# Patient Record
Sex: Female | Born: 1985 | Race: White | Hispanic: No | Marital: Married | Smoking: Never smoker
Health system: Southern US, Community
[De-identification: ages and names within clinical notes are randomized; demographics above are authoritative.]

## PROBLEM LIST (undated history)

## (undated) ENCOUNTER — Inpatient Hospital Stay (HOSPITAL_COMMUNITY): Payer: Self-pay

## (undated) DIAGNOSIS — E282 Polycystic ovarian syndrome: Secondary | ICD-10-CM

## (undated) DIAGNOSIS — Z789 Other specified health status: Secondary | ICD-10-CM

## (undated) HISTORY — PX: BREAST ENHANCEMENT SURGERY: SHX7

---

## 2012-03-24 LAB — OB RESULTS CONSOLE HIV ANTIBODY (ROUTINE TESTING): HIV: NONREACTIVE

## 2012-03-24 LAB — OB RESULTS CONSOLE RPR: RPR: NONREACTIVE

## 2012-03-24 LAB — OB RESULTS CONSOLE ABO/RH: RH Type: POSITIVE

## 2012-03-31 ENCOUNTER — Other Ambulatory Visit: Payer: Self-pay

## 2012-06-30 ENCOUNTER — Other Ambulatory Visit (HOSPITAL_COMMUNITY): Payer: Self-pay | Admitting: Obstetrics & Gynecology

## 2012-06-30 DIAGNOSIS — O358XX0 Maternal care for other (suspected) fetal abnormality and damage, not applicable or unspecified: Secondary | ICD-10-CM

## 2012-07-07 ENCOUNTER — Ambulatory Visit (HOSPITAL_COMMUNITY)
Admission: RE | Admit: 2012-07-07 | Discharge: 2012-07-07 | Disposition: A | Discharge status: Home / Self Care | Payer: BC Managed Care – PPO | Source: Ambulatory Visit | Attending: Obstetrics & Gynecology | Admitting: Obstetrics & Gynecology

## 2012-07-07 ENCOUNTER — Encounter (HOSPITAL_COMMUNITY): Payer: Self-pay

## 2012-07-07 VITALS — BP 118/75 | HR 66 | Wt 164.5 lb

## 2012-07-07 DIAGNOSIS — Z363 Encounter for antenatal screening for malformations: Secondary | ICD-10-CM | POA: Insufficient documentation

## 2012-07-07 DIAGNOSIS — Z1389 Encounter for screening for other disorder: Secondary | ICD-10-CM | POA: Insufficient documentation

## 2012-07-07 DIAGNOSIS — O358XX0 Maternal care for other (suspected) fetal abnormality and damage, not applicable or unspecified: Secondary | ICD-10-CM

## 2012-07-07 NOTE — Progress Notes (Signed)
Marissa Acevedo was seen for ultrasound appointment today.  Please see [facility] report for details.

## 2012-07-24 ENCOUNTER — Encounter (HOSPITAL_COMMUNITY): Payer: Self-pay | Admitting: *Deleted

## 2012-07-24 ENCOUNTER — Inpatient Hospital Stay (HOSPITAL_COMMUNITY)
Admission: AD | Admit: 2012-07-24 | Discharge: 2012-07-24 | Disposition: A | Discharge status: Home / Self Care | Payer: BC Managed Care – PPO | Source: Ambulatory Visit | Attending: Obstetrics and Gynecology | Admitting: Obstetrics and Gynecology

## 2012-07-24 DIAGNOSIS — O99891 Other specified diseases and conditions complicating pregnancy: Secondary | ICD-10-CM | POA: Insufficient documentation

## 2012-07-24 DIAGNOSIS — M549 Dorsalgia, unspecified: Secondary | ICD-10-CM | POA: Insufficient documentation

## 2012-07-24 DIAGNOSIS — R319 Hematuria, unspecified: Secondary | ICD-10-CM | POA: Insufficient documentation

## 2012-07-24 DIAGNOSIS — N3090 Cystitis, unspecified without hematuria: Secondary | ICD-10-CM

## 2012-07-24 DIAGNOSIS — R300 Dysuria: Secondary | ICD-10-CM | POA: Insufficient documentation

## 2012-07-24 HISTORY — DX: Polycystic ovarian syndrome: E28.2

## 2012-07-24 LAB — URINALYSIS, ROUTINE W REFLEX MICROSCOPIC
Glucose, UA: NEGATIVE mg/dL
Protein, ur: NEGATIVE mg/dL

## 2012-07-24 LAB — URINE MICROSCOPIC-ADD ON

## 2012-07-24 MED ORDER — NITROFURANTOIN MONOHYD MACRO 100 MG PO CAPS: 100.0000 mg | ORAL_CAPSULE | Freq: Two times a day (BID) | ORAL | Status: AC

## 2012-07-24 MED ORDER — NITROFURANTOIN MONOHYD MACRO 100 MG PO CAPS: 100.0000 mg | ORAL_CAPSULE | Freq: Two times a day (BID) | ORAL | Status: DC

## 2012-07-24 NOTE — MAU Note (Signed)
Pt presents with complaints of painful urination and she noticed some blood in her urine.

## 2012-07-24 NOTE — MAU Provider Note (Signed)
  History     CSN: [id number]  Arrival date and time: 07/24/12 1840 Call to provider @ 1929 Provider here to examine patient @ 2002     Chief Complaint  Patient presents with  . Dysuria   HPI  Just returning from vacation at beach Dysuria / urgency / hematuria x 24 hous at beach No fever or chills + generalized back pain HX recurrent UTI as child - none in several years  Past Medical History  Diagnosis Date  . PCOS (polycystic ovarian syndrome)     Past Surgical History  Procedure Laterality Date  . Breast enhancement surgery      History reviewed. No pertinent family history.  History  Substance Use Topics  . Smoking status: Never Smoker   . Smokeless tobacco: Never Used  . Alcohol Use: No    Allergies: No Known Allergies  Prescriptions prior to admission  Medication Sig Dispense Refill  . Prenatal Vit w/Fe-Methylfol-FA (PNV PO) Take by mouth.        ROS Physical Exam   Blood pressure 117/88, pulse 79, temperature 98.2 F (36.8 C), temperature source Oral, resp. rate 18, height 5\' 4"  (1.626 m), weight 78.019 kg (172 lb), last menstrual period 01/03/2012.  Physical Exam Alert and oriented/ NAD Heart RRR Lungs - clear Abdomen - soft / non-tender / active BS CVA tenderness - negative bilaterally No suprapubic pain or pressure  Cervix: closed / long / presenting part out of pelvis  MAU Course  Procedures  Urinalysis:  Results for Marissa Acevedo, Marissa Acevedo (MRN [id number]) as of 07/24/2012 20:25  Ref. Range 07/24/2012 18:50  Color, Urine Latest Range: YELLOW  YELLOW  APPearance Latest Range: CLEAR  HAZY (A)  Specific Gravity, Urine Latest Range: 1.005-1.030  1.010  pH Latest Range: 5.0-8.0  7.5  Glucose Latest Range: NEGATIVE mg/dL NEGATIVE  Bilirubin Urine Latest Range: NEGATIVE  NEGATIVE  Ketones, ur Latest Range: NEGATIVE mg/dL NEGATIVE  Protein Latest Range: NEGATIVE mg/dL NEGATIVE  Urobilinogen, UA Latest Range: 0.0-1.0 mg/dL 0.2  Nitrite Latest Range:  NEGATIVE  NEGATIVE  Leukocytes, UA Latest Range: NEGATIVE  MODERATE (A)  Hgb urine dipstick Latest Range: NEGATIVE  TRACE (A)  WBC, UA Latest Range: <3 WBC/hpf 11-20  RBC / HPF Latest Range: <3 RBC/hpf 0-2  Squamous Epithelial / LPF Latest Range: RARE  FEW (A)  Bacteria, UA Latest Range: RARE  MANY (A)    Assessment and Plan  29 weeks Likely cystitis No evidence of PTL or ascending UTI  1) urine culture pending 2) start 3 day course Macrobid pending urine culture results 3) increase water hydration / lemonade & cranberry juice 4) call any fever - chills - vomiting - severe unilateral back pain 5) keep ROB visit at [facility] this week as scheduled  Marlinda Mike 07/24/2012, 8:22 PM

## 2012-07-26 LAB — URINE CULTURE: Colony Count: 15000

## 2012-08-02 ENCOUNTER — Other Ambulatory Visit (HOSPITAL_COMMUNITY): Payer: Self-pay | Admitting: Obstetrics & Gynecology

## 2012-08-02 DIAGNOSIS — R9389 Abnormal findings on diagnostic imaging of other specified body structures: Secondary | ICD-10-CM

## 2012-08-04 ENCOUNTER — Ambulatory Visit (HOSPITAL_COMMUNITY)
Admission: RE | Admit: 2012-08-04 | Discharge: 2012-08-04 | Disposition: A | Discharge status: Home / Self Care | Payer: BC Managed Care – PPO | Source: Ambulatory Visit | Attending: Obstetrics and Gynecology | Admitting: Obstetrics and Gynecology

## 2012-08-04 VITALS — BP 129/88 | HR 87 | Wt 171.5 lb

## 2012-08-04 DIAGNOSIS — O358XX0 Maternal care for other (suspected) fetal abnormality and damage, not applicable or unspecified: Secondary | ICD-10-CM | POA: Insufficient documentation

## 2012-08-04 DIAGNOSIS — E282 Polycystic ovarian syndrome: Secondary | ICD-10-CM | POA: Insufficient documentation

## 2012-08-04 DIAGNOSIS — O34599 Maternal care for other abnormalities of gravid uterus, unspecified trimester: Secondary | ICD-10-CM | POA: Insufficient documentation

## 2012-08-04 DIAGNOSIS — R9389 Abnormal findings on diagnostic imaging of other specified body structures: Secondary | ICD-10-CM

## 2012-09-01 ENCOUNTER — Ambulatory Visit (HOSPITAL_COMMUNITY)
Admission: RE | Admit: 2012-09-01 | Discharge: 2012-09-01 | Disposition: A | Discharge status: Home / Self Care | Payer: BC Managed Care – PPO | Source: Ambulatory Visit | Attending: Obstetrics and Gynecology | Admitting: Obstetrics and Gynecology

## 2012-09-01 VITALS — BP 118/76 | HR 92 | Wt 182.0 lb

## 2012-09-01 DIAGNOSIS — O358XX0 Maternal care for other (suspected) fetal abnormality and damage, not applicable or unspecified: Secondary | ICD-10-CM | POA: Insufficient documentation

## 2012-09-01 DIAGNOSIS — R9389 Abnormal findings on diagnostic imaging of other specified body structures: Secondary | ICD-10-CM

## 2012-09-01 NOTE — Progress Notes (Signed)
[facility] ultrasound  Indication: 27 yr old G1P0 at [redacted]w[redacted]d with fetus with lagging long bones for follow up ultrasound.  Findings: 1. Single intrauterine pregnancy. 2. Estimated fetal weight is in the 23rd%. The long bones lag dating by 3-5 weeks. The abdominal circumference is in the 13th%. 3. Right lateral placenta without evidence of previa. 4. Normal amniotic fluid index. 5. The limited anatomy survey is normal. The appearance of the long bones is normal- there is normal echogenicity and shape. No fractures are seen. 6. Fetus is in breech presentation.  Recommendations: 1. Overall appropriate fetal growth: - given slightly lagging abdominal circumference and lagging long bones recommend follow up fetal growth in 3 weeks 2. Lagging long bones: - previously counseled - reiterated may be constitutional or skeletal dysplasia - discussed if it is a skeletal dysplasia; clinical features do not suggest a lethal dysplasia - recommend inform Pediatrics at delivery for evaluation and consideration of further work up  Marissa Foster, MD

## 2012-09-14 ENCOUNTER — Other Ambulatory Visit: Payer: Self-pay | Admitting: Obstetrics & Gynecology

## 2012-09-22 ENCOUNTER — Ambulatory Visit (HOSPITAL_COMMUNITY)
Admission: RE | Admit: 2012-09-22 | Discharge: 2012-09-22 | Disposition: A | Discharge status: Home / Self Care | Payer: BC Managed Care – PPO | Source: Ambulatory Visit | Attending: Obstetrics and Gynecology | Admitting: Obstetrics and Gynecology

## 2012-09-22 DIAGNOSIS — Z3689 Encounter for other specified antenatal screening: Secondary | ICD-10-CM | POA: Insufficient documentation

## 2012-09-22 DIAGNOSIS — O358XX0 Maternal care for other (suspected) fetal abnormality and damage, not applicable or unspecified: Secondary | ICD-10-CM | POA: Insufficient documentation

## 2012-09-22 DIAGNOSIS — R9389 Abnormal findings on diagnostic imaging of other specified body structures: Secondary | ICD-10-CM

## 2012-09-22 NOTE — Progress Notes (Signed)
Marissa Acevedo  was seen today for an ultrasound appointment.  See full report in [facility].  Comments: [name] returns for follow up due to shortened long bones.  Again, all long bones appear shortened (< 5th %tile) but appear to have normal morphology -without bowing or obvious fractures.  No other features suggestive of skeletal dysplasia were noted (i.e. frontal blossing), but the fetal profile was unable to be evaluated today due to fetal presentation.   Overall fetal growth is appropriate.  On exam today, there is the fetal penis appears to be small and blunted- suggestive of hypospadias.  This is a new finding - reviewed images from earlier ultarsounds and this is not appreciated.  The findings and limitations of the study were again reviewed with the patient.  Based on her late gestational age, would recommend that Peds be available evaluate the infant after delivery.  The couple is aware that if hypospadias is indeed confirmed after delivery, there may be a need for Peds urology evaluaton and possible surgical intervention.  Impression: Single IUP at 37 4/7 weeks Overall fetal growth is appropriate (31st %tile) All long bones are shortened (< 5th %tile), but no other stigmata associated with skeletal dysplasia were appreciated ? hypospadias (see comments above) Normal amniotic fluid volume  Recommendations: See comments.   Pediatric evalaution of the newborn after delivery Follow-up ultrasounds as clinically indicated.   Alpha Gula, MD

## 2012-09-28 ENCOUNTER — Encounter (HOSPITAL_COMMUNITY): Payer: Self-pay | Admitting: Pharmacist

## 2012-09-30 ENCOUNTER — Encounter (HOSPITAL_COMMUNITY): Payer: Self-pay

## 2012-10-01 ENCOUNTER — Encounter (HOSPITAL_COMMUNITY): Payer: Self-pay

## 2012-10-01 ENCOUNTER — Encounter (HOSPITAL_COMMUNITY)
Admission: RE | Admit: 2012-10-01 | Discharge: 2012-10-01 | Disposition: A | Discharge status: Home / Self Care | Payer: BC Managed Care – PPO | Source: Ambulatory Visit | Attending: Obstetrics & Gynecology | Admitting: Obstetrics & Gynecology

## 2012-10-01 DIAGNOSIS — Z01818 Encounter for other preprocedural examination: Secondary | ICD-10-CM | POA: Insufficient documentation

## 2012-10-01 DIAGNOSIS — Z01812 Encounter for preprocedural laboratory examination: Secondary | ICD-10-CM | POA: Insufficient documentation

## 2012-10-01 HISTORY — DX: Polycystic ovarian syndrome: E28.2

## 2012-10-01 HISTORY — DX: Other specified health status: Z78.9

## 2012-10-01 LAB — TYPE AND SCREEN
ABO/RH(D): O POS
Antibody Screen: NEGATIVE

## 2012-10-01 LAB — CBC
MCH: 31 pg (ref 26.0–34.0)
Platelets: 219 10*3/uL (ref 150–400)
RBC: 4.19 MIL/uL (ref 3.87–5.11)

## 2012-10-01 LAB — ABO/RH: ABO/RH(D): O POS

## 2012-10-01 NOTE — Patient Instructions (Signed)
Your procedure is scheduled on:10/04/12  Enter through the Main Entrance at :0730 am Pick up desk phone and dial [phone] and inform us of your arrival.  Please call [phone] if you have any problems the morning of surgery.  Remember: Do not eat food or drink liquids, including water, after midnight:Sunday   You may brush your teeth the morning of surgery.   DO NOT wear jewelry, eye make-up, lipstick,body lotion, or dark fingernail polish.  (Polished toes are ok) You may wear deodorant.  If you are to be admitted after surgery, leave suitcase in car until your room has been assigned. Wear loose fitting, comfortable clothes for your ride home.

## 2012-10-03 NOTE — H&P (Addendum)
Anella Nakata is a 27 y.o. female presenting at 13 wks for primary cesarean section for breech.  Ob care - [facility]- Dr Juliene Pina, Clomid conception, Metformin until 14 wks. 1st anatomy sono noted short long bones and since then patient has had several f/up sonograms with MFM, long bones are short but no other stigmata  achondroplasia and there is possibly genetic inheritance of short stature suspected from husband's side. Last MFM sono suspected Hypospadias, will have Peds evaluate after birth.  No other Ob concerns.  History OB History   Grav Para Term Preterm Abortions TAB SAB Ect Mult Living   1              Past Medical History  Diagnosis Date  . PCOS (polycystic ovarian syndrome)   . Polycystic disease, ovaries   . Medical history non-contributory    Past Surgical History  Procedure Laterality Date  . Breast enhancement surgery     Family History: family history is not on file. Social History:  reports that she has never smoked. She has never used smokeless tobacco. She reports that she does not drink alcohol. Her drug history is not on file.   Prenatal Transfer Tool  Maternal Diabetes: No Genetic Screening: Ultrascreen neg, NT normal, AFP1 normal Maternal Ultrasounds/Referrals: Abnormal:  Findings:   Other: short long bones, lagging AC, possible hypospadias. Pt declined amniocentesis (saw MFM). Fetal Ultrasounds or other Referrals:  None Maternal Substance Abuse:  No Significant Maternal Medications:  None Significant Maternal Lab Results:  None Other Comments:  Clomid conception, Metformin in 1st trimester.   Review of Systems  Constitutional: Negative for fever.  Eyes: Negative for blurred vision.  Respiratory: Negative for cough and sputum production.   Psychiatric/Behavioral: Negative for depression.     Height 5' 5.5" (1.664 m), weight 184 lb (83.462 kg), last menstrual period 01/03/2012. Exam Physical Exam  A&O x 3, no acute distress. Pleasant HEENT neg, no  thyromegaly Lungs CTA bilat CV RRR, S1S2 normal Abdo soft, non tender, non acute gravid uterus Extr no edema/ tenderness Pelvic deferred FHT normal  Prenatal labs: ABO, Rh: --/--/O POS, O POS (09/26 0920) Antibody: NEG (09/26 0920) Rubella: Immune (03/19 0000) RPR: NON REACTIVE (09/26 0920)  HBsAg: Negative (03/19 0000)  HIV: Non-reactive (03/19 0000)  GBS:   negative Glucola normal  Assessment/Plan: 27 yo, G1 at 39 wks with Breech for C/section. Short long bones on fetal survey, suspected hypospadias, no other markers of achondroplasia. Plan further evaluation by Peds and genetics if indicated.   Risks/complications of surgery reviewed incl infection, bleeding, damage to internal organs including bladder, bowels, ureters, blood vessels, other risks from anesthesia, VTE and delayed complications of any surgery, complications in future surgery reviewed. Also discussed neonatal complications incl difficult delivery, laceration, vacuum assistance, TTN etc. Pt understands and agrees, all concerns addressed.      Jahrell Hamor R 10/03/2012, 9:14 PM  H&P Update-- 09/03/12  Reviewed H&P, agree with note and plan with no new changes. Marland Kitchen  V.Kimyah Frein, MD

## 2012-10-04 ENCOUNTER — Encounter (HOSPITAL_COMMUNITY): Payer: Self-pay | Admitting: Anesthesiology

## 2012-10-04 ENCOUNTER — Encounter (HOSPITAL_COMMUNITY)
Admission: RE | Disposition: A | Discharge status: Home / Self Care | Payer: Self-pay | Source: Ambulatory Visit | Attending: Obstetrics & Gynecology

## 2012-10-04 ENCOUNTER — Inpatient Hospital Stay (HOSPITAL_COMMUNITY): Payer: BC Managed Care – PPO | Admitting: Anesthesiology

## 2012-10-04 ENCOUNTER — Inpatient Hospital Stay (HOSPITAL_COMMUNITY)
Admission: RE | Admit: 2012-10-04 | Discharge: 2012-10-07 | DRG: 371 | Disposition: A | Discharge status: Home / Self Care | Payer: BC Managed Care – PPO | Source: Ambulatory Visit | Attending: Obstetrics & Gynecology | Admitting: Obstetrics & Gynecology

## 2012-10-04 DIAGNOSIS — O321XX0 Maternal care for breech presentation, not applicable or unspecified: Principal | ICD-10-CM | POA: Diagnosis present

## 2012-10-04 DIAGNOSIS — O358XX0 Maternal care for other (suspected) fetal abnormality and damage, not applicable or unspecified: Secondary | ICD-10-CM | POA: Diagnosis present

## 2012-10-04 SURGERY — Surgical Case
Anesthesia: Spinal | Site: Abdomen | Wound class: Clean Contaminated

## 2012-10-04 MED ORDER — MENTHOL 3 MG MT LOZG: 1.0000 | LOZENGE | OROMUCOSAL | Status: DC | PRN

## 2012-10-04 MED ORDER — SIMETHICONE 80 MG PO CHEW: 80.0000 mg | CHEWABLE_TABLET | ORAL | Status: DC | PRN

## 2012-10-04 MED ORDER — NALBUPHINE SYRINGE 5 MG/0.5 ML
5.0000 mg | INJECTION | INTRAMUSCULAR | Status: DC | PRN
  Filled 2012-10-04: qty 1

## 2012-10-04 MED ORDER — LACTATED RINGERS IV SOLN
INTRAVENOUS | Status: DC
  Administered 2012-10-04: 18:00:00 via INTRAVENOUS

## 2012-10-04 MED ORDER — NALOXONE HCL 0.4 MG/ML IJ SOLN: 0.4000 mg | INTRAMUSCULAR | Status: DC | PRN

## 2012-10-04 MED ORDER — MEPERIDINE HCL 25 MG/ML IJ SOLN: 6.2500 mg | INTRAMUSCULAR | Status: DC | PRN

## 2012-10-04 MED ORDER — NALBUPHINE SYRINGE 5 MG/0.5 ML
INJECTION | INTRAMUSCULAR | Status: AC
  Filled 2012-10-04: qty 1

## 2012-10-04 MED ORDER — ATROPINE SULFATE 0.4 MG/ML IJ SOLN
INTRAMUSCULAR | Status: DC | PRN
  Administered 2012-10-04: 0.4 mg via INTRAVENOUS

## 2012-10-04 MED ORDER — NALOXONE HCL 1 MG/ML IJ SOLN
1.0000 ug/kg/h | INTRAMUSCULAR | Status: DC | PRN
  Filled 2012-10-04: qty 2

## 2012-10-04 MED ORDER — ONDANSETRON HCL 4 MG/2ML IJ SOLN: 4.0000 mg | INTRAMUSCULAR | Status: DC | PRN

## 2012-10-04 MED ORDER — SENNOSIDES-DOCUSATE SODIUM 8.6-50 MG PO TABS
2.0000 | ORAL_TABLET | ORAL | Status: DC
  Administered 2012-10-05 – 2012-10-06 (×3): 2 via ORAL

## 2012-10-04 MED ORDER — KETOROLAC TROMETHAMINE 30 MG/ML IJ SOLN: 30.0000 mg | Freq: Four times a day (QID) | INTRAMUSCULAR | Status: AC | PRN

## 2012-10-04 MED ORDER — OXYCODONE-ACETAMINOPHEN 5-325 MG PO TABS
1.0000 | ORAL_TABLET | ORAL | Status: DC | PRN
  Administered 2012-10-05: 1 via ORAL
  Administered 2012-10-05: 2 via ORAL
  Administered 2012-10-05 (×2): 1 via ORAL
  Administered 2012-10-05: 2 via ORAL
  Administered 2012-10-06 (×5): 1 via ORAL
  Administered 2012-10-07 (×2): 2 via ORAL
  Filled 2012-10-04 (×2): qty 1
  Filled 2012-10-04: qty 2
  Filled 2012-10-04 (×2): qty 1
  Filled 2012-10-04: qty 2
  Filled 2012-10-04: qty 1
  Filled 2012-10-04: qty 2
  Filled 2012-10-04 (×2): qty 1
  Filled 2012-10-04: qty 2
  Filled 2012-10-04: qty 1

## 2012-10-04 MED ORDER — FENTANYL CITRATE 0.05 MG/ML IJ SOLN
INTRAMUSCULAR | Status: DC | PRN
  Administered 2012-10-04: 25 ug via INTRATHECAL

## 2012-10-04 MED ORDER — FENTANYL CITRATE 0.05 MG/ML IJ SOLN
INTRAMUSCULAR | Status: AC
  Filled 2012-10-04: qty 2

## 2012-10-04 MED ORDER — METOCLOPRAMIDE HCL 5 MG/ML IJ SOLN: 10.0000 mg | Freq: Three times a day (TID) | INTRAMUSCULAR | Status: DC | PRN

## 2012-10-04 MED ORDER — NALBUPHINE HCL 10 MG/ML IJ SOLN: 5.0000 mg | INTRAMUSCULAR | Status: DC | PRN

## 2012-10-04 MED ORDER — DIPHENHYDRAMINE HCL 50 MG/ML IJ SOLN: 25.0000 mg | INTRAMUSCULAR | Status: DC | PRN

## 2012-10-04 MED ORDER — FENTANYL CITRATE 0.05 MG/ML IJ SOLN: 25.0000 ug | INTRAMUSCULAR | Status: DC | PRN

## 2012-10-04 MED ORDER — ZOLPIDEM TARTRATE 5 MG PO TABS: 5.0000 mg | ORAL_TABLET | Freq: Every evening | ORAL | Status: DC | PRN

## 2012-10-04 MED ORDER — DIBUCAINE 1 % RE OINT: 1.0000 "application " | TOPICAL_OINTMENT | RECTAL | Status: DC | PRN

## 2012-10-04 MED ORDER — MORPHINE SULFATE 0.5 MG/ML IJ SOLN
INTRAMUSCULAR | Status: AC
  Filled 2012-10-04: qty 10

## 2012-10-04 MED ORDER — DIPHENHYDRAMINE HCL 50 MG/ML IJ SOLN: 12.5000 mg | INTRAMUSCULAR | Status: DC | PRN

## 2012-10-04 MED ORDER — METOCLOPRAMIDE HCL 5 MG/ML IJ SOLN: 10.0000 mg | Freq: Once | INTRAMUSCULAR | Status: DC | PRN

## 2012-10-04 MED ORDER — DIPHENHYDRAMINE HCL 25 MG PO CAPS: 25.0000 mg | ORAL_CAPSULE | Freq: Four times a day (QID) | ORAL | Status: DC | PRN

## 2012-10-04 MED ORDER — OXYTOCIN 40 UNITS IN LACTATED RINGERS INFUSION - SIMPLE MED: 62.5000 mL/h | INTRAVENOUS | Status: AC

## 2012-10-04 MED ORDER — ONDANSETRON HCL 4 MG/2ML IJ SOLN
INTRAMUSCULAR | Status: AC
  Filled 2012-10-04: qty 2

## 2012-10-04 MED ORDER — PHENYLEPHRINE HCL 10 MG/ML IJ SOLN
INTRAMUSCULAR | Status: DC | PRN
  Administered 2012-10-04: 40 ug via INTRAVENOUS
  Administered 2012-10-04: 80 ug via INTRAVENOUS
  Administered 2012-10-04: 40 ug via INTRAVENOUS
  Administered 2012-10-04: 80 ug via INTRAVENOUS

## 2012-10-04 MED ORDER — PHENYLEPHRINE 40 MCG/ML (10ML) SYRINGE FOR IV PUSH (FOR BLOOD PRESSURE SUPPORT)
PREFILLED_SYRINGE | INTRAVENOUS | Status: AC
  Filled 2012-10-04: qty 5

## 2012-10-04 MED ORDER — OXYTOCIN 10 UNIT/ML IJ SOLN
40.0000 [IU] | INTRAVENOUS | Status: DC | PRN
  Administered 2012-10-04: 40 [IU] via INTRAVENOUS

## 2012-10-04 MED ORDER — ONDANSETRON HCL 4 MG/2ML IJ SOLN: 4.0000 mg | Freq: Three times a day (TID) | INTRAMUSCULAR | Status: DC | PRN

## 2012-10-04 MED ORDER — KETOROLAC TROMETHAMINE 30 MG/ML IJ SOLN
30.0000 mg | Freq: Four times a day (QID) | INTRAMUSCULAR | Status: DC | PRN
  Administered 2012-10-04: 30 mg via INTRAMUSCULAR

## 2012-10-04 MED ORDER — SIMETHICONE 80 MG PO CHEW
80.0000 mg | CHEWABLE_TABLET | Freq: Three times a day (TID) | ORAL | Status: DC
  Administered 2012-10-04 – 2012-10-07 (×8): 80 mg via ORAL

## 2012-10-04 MED ORDER — PRENATAL MULTIVITAMIN CH
1.0000 | ORAL_TABLET | Freq: Every day | ORAL | Status: DC
  Administered 2012-10-05 – 2012-10-07 (×3): 1 via ORAL
  Filled 2012-10-04 (×3): qty 1

## 2012-10-04 MED ORDER — KETOROLAC TROMETHAMINE 60 MG/2ML IM SOLN
60.0000 mg | Freq: Once | INTRAMUSCULAR | Status: AC | PRN
  Filled 2012-10-04: qty 2

## 2012-10-04 MED ORDER — ATROPINE SULFATE 0.1 MG/ML IJ SOLN
INTRAMUSCULAR | Status: AC
  Filled 2012-10-04: qty 10

## 2012-10-04 MED ORDER — SCOPOLAMINE 1 MG/3DAYS TD PT72
1.0000 | MEDICATED_PATCH | Freq: Once | TRANSDERMAL | Status: DC
  Administered 2012-10-04: 1.5 mg via TRANSDERMAL

## 2012-10-04 MED ORDER — KETOROLAC TROMETHAMINE 30 MG/ML IJ SOLN
INTRAMUSCULAR | Status: AC
  Administered 2012-10-04: 30 mg via INTRAMUSCULAR
  Filled 2012-10-04: qty 1

## 2012-10-04 MED ORDER — ONDANSETRON HCL 4 MG PO TABS: 4.0000 mg | ORAL_TABLET | ORAL | Status: DC | PRN

## 2012-10-04 MED ORDER — ONDANSETRON HCL 4 MG/2ML IJ SOLN
INTRAMUSCULAR | Status: DC | PRN
  Administered 2012-10-04: 4 mg via INTRAVENOUS

## 2012-10-04 MED ORDER — CEFAZOLIN SODIUM-DEXTROSE 2-3 GM-% IV SOLR
2.0000 g | INTRAVENOUS | Status: AC
  Administered 2012-10-04: 2 g via INTRAVENOUS

## 2012-10-04 MED ORDER — SODIUM CHLORIDE 0.9 % IJ SOLN: 3.0000 mL | INTRAMUSCULAR | Status: DC | PRN

## 2012-10-04 MED ORDER — KETOROLAC TROMETHAMINE 30 MG/ML IJ SOLN: 30.0000 mg | Freq: Four times a day (QID) | INTRAMUSCULAR | Status: DC | PRN

## 2012-10-04 MED ORDER — LANOLIN HYDROUS EX OINT
1.0000 | TOPICAL_OINTMENT | CUTANEOUS | Status: DC | PRN
Start: 2012-10-04 — End: 2012-10-07

## 2012-10-04 MED ORDER — TETANUS-DIPHTH-ACELL PERTUSSIS 5-2.5-18.5 LF-MCG/0.5 IM SUSP: 0.5000 mL | Freq: Once | INTRAMUSCULAR | Status: DC

## 2012-10-04 MED ORDER — WITCH HAZEL-GLYCERIN EX PADS: 1.0000 "application " | MEDICATED_PAD | CUTANEOUS | Status: DC | PRN

## 2012-10-04 MED ORDER — DIPHENHYDRAMINE HCL 25 MG PO CAPS
25.0000 mg | ORAL_CAPSULE | ORAL | Status: DC | PRN
  Filled 2012-10-04: qty 1

## 2012-10-04 MED ORDER — BUPIVACAINE IN DEXTROSE 0.75-8.25 % IT SOLN
INTRATHECAL | Status: DC | PRN
  Administered 2012-10-04: 1.6 mL via INTRATHECAL

## 2012-10-04 MED ORDER — MORPHINE SULFATE (PF) 0.5 MG/ML IJ SOLN
INTRAMUSCULAR | Status: DC | PRN
  Administered 2012-10-04: .15 mg via INTRATHECAL

## 2012-10-04 MED ORDER — LACTATED RINGERS IV SOLN
INTRAVENOUS | Status: DC | PRN
  Administered 2012-10-04 (×3): via INTRAVENOUS

## 2012-10-04 MED ORDER — IBUPROFEN 600 MG PO TABS
600.0000 mg | ORAL_TABLET | Freq: Four times a day (QID) | ORAL | Status: DC
  Administered 2012-10-04 – 2012-10-07 (×12): 600 mg via ORAL
  Filled 2012-10-04 (×11): qty 1

## 2012-10-04 MED ORDER — SCOPOLAMINE 1 MG/3DAYS TD PT72
MEDICATED_PATCH | TRANSDERMAL | Status: AC
  Filled 2012-10-04: qty 1

## 2012-10-04 MED ORDER — CEFAZOLIN SODIUM-DEXTROSE 2-3 GM-% IV SOLR
INTRAVENOUS | Status: AC
  Filled 2012-10-04: qty 50

## 2012-10-04 MED ORDER — LACTATED RINGERS IV SOLN
Freq: Once | INTRAVENOUS | Status: AC
  Administered 2012-10-04: 08:00:00 via INTRAVENOUS

## 2012-10-04 MED ORDER — SIMETHICONE 80 MG PO CHEW
80.0000 mg | CHEWABLE_TABLET | ORAL | Status: DC
  Administered 2012-10-06 (×2): 80 mg via ORAL

## 2012-10-04 MED ORDER — DEXTROSE 5 % IV SOLN: 1.0000 ug/kg/h | INTRAVENOUS | Status: DC | PRN

## 2012-10-04 MED ORDER — OXYTOCIN 10 UNIT/ML IJ SOLN
INTRAMUSCULAR | Status: AC
  Filled 2012-10-04: qty 4

## 2012-10-04 MED ORDER — DIPHENHYDRAMINE HCL 25 MG PO CAPS: 25.0000 mg | ORAL_CAPSULE | ORAL | Status: DC | PRN

## 2012-10-04 SURGICAL SUPPLY — 37 items
BENZOIN TINCTURE PRP APPL 2/3 (GAUZE/BANDAGES/DRESSINGS) IMPLANT
CLAMP CORD UMBIL (MISCELLANEOUS) IMPLANT
CLOTH BEACON ORANGE TIMEOUT ST (SAFETY) ×2 IMPLANT
CONTAINER PREFILL 10% NBF 15ML (MISCELLANEOUS) IMPLANT
DRAPE LG THREE QUARTER DISP (DRAPES) ×4 IMPLANT
DRSG OPSITE POSTOP 4X10 (GAUZE/BANDAGES/DRESSINGS) ×2 IMPLANT
DURAPREP 26ML APPLICATOR (WOUND CARE) ×2 IMPLANT
ELECT REM PT RETURN 9FT ADLT (ELECTROSURGICAL) ×2
ELECTRODE REM PT RTRN 9FT ADLT (ELECTROSURGICAL) ×1 IMPLANT
EXTRACTOR VACUUM KIWI (MISCELLANEOUS) IMPLANT
EXTRACTOR VACUUM M CUP 4 TUBE (SUCTIONS) IMPLANT
GLOVE BIO SURGEON STRL SZ7 (GLOVE) ×2 IMPLANT
GLOVE BIOGEL PI IND STRL 7.0 (GLOVE) ×1 IMPLANT
GLOVE BIOGEL PI INDICATOR 7.0 (GLOVE) ×1
GOWN PREVENTION PLUS XLARGE (GOWN DISPOSABLE) ×4 IMPLANT
GOWN STRL REIN XL XLG (GOWN DISPOSABLE) ×4 IMPLANT
KIT ABG SYR 3ML LUER SLIP (SYRINGE) IMPLANT
NEEDLE HYPO 25X5/8 SAFETYGLIDE (NEEDLE) IMPLANT
NS IRRIG 1000ML POUR BTL (IV SOLUTION) ×2 IMPLANT
PACK C SECTION WH (CUSTOM PROCEDURE TRAY) ×2 IMPLANT
PAD OB MATERNITY 4.3X12.25 (PERSONAL CARE ITEMS) ×2 IMPLANT
RTRCTR C-SECT PINK 25CM LRG (MISCELLANEOUS) IMPLANT
STAPLER VISISTAT 35W (STAPLE) IMPLANT
STRIP CLOSURE SKIN 1/4X4 (GAUZE/BANDAGES/DRESSINGS) IMPLANT
SUT MON AB-0 CT1 36 (SUTURE) ×6 IMPLANT
SUT PLAIN 0 NONE (SUTURE) IMPLANT
SUT PLAIN 2 0 (SUTURE)
SUT PLAIN ABS 2-0 CT1 27XMFL (SUTURE) IMPLANT
SUT VIC AB 0 CT1 27 (SUTURE) ×2
SUT VIC AB 0 CT1 27XBRD ANBCTR (SUTURE) ×2 IMPLANT
SUT VIC AB 2-0 CT1 27 (SUTURE) ×2
SUT VIC AB 2-0 CT1 TAPERPNT 27 (SUTURE) ×2 IMPLANT
SUT VIC AB 4-0 KS 27 (SUTURE) IMPLANT
SUT VICRYL 0 TIES 12 18 (SUTURE) IMPLANT
TOWEL OR 17X24 6PK STRL BLUE (TOWEL DISPOSABLE) ×2 IMPLANT
TRAY FOLEY CATH 14FR (SET/KITS/TRAYS/PACK) IMPLANT
WATER STERILE IRR 1000ML POUR (IV SOLUTION) ×2 IMPLANT

## 2012-10-04 NOTE — Transfer of Care (Signed)
Immediate Anesthesia Transfer of Care Note  Patient: Marissa Acevedo  Procedure(s) Performed: Procedure(s) with comments: Primary CESAREAN SECTION (N/A) - EDD: 10/09/12  Patient Location: PACU  Anesthesia Type:Spinal  Level of Consciousness: awake, alert  and oriented  Airway & Oxygen Therapy: Patient Spontanous Breathing  Post-op Assessment: Report given to PACU RN and Post -op Vital signs reviewed and stable  Post vital signs: stable  Complications: No apparent anesthesia complications

## 2012-10-04 NOTE — Anesthesia Preprocedure Evaluation (Addendum)
Anesthesia Evaluation  Patient identified by MRN, date of birth, ID band Patient awake    Reviewed: Allergy & Precautions, H&P , NPO status , Patient's Chart, lab work & pertinent test results  Airway Mallampati: I TM Distance: >3 FB Neck ROM: Full    Dental no notable dental hx. (+) Teeth Intact   Pulmonary neg pulmonary ROS,  breath sounds clear to auscultation  Pulmonary exam normal       Cardiovascular negative cardio ROS  Rhythm:Regular Rate:Normal     Neuro/Psych negative neurological ROS  negative psych ROS   GI/Hepatic negative GI ROS, Neg liver ROS, GERD-  ,  Endo/Other  negative endocrine ROS  Renal/GU negative Renal ROS  negative genitourinary   Musculoskeletal negative musculoskeletal ROS (+)   Abdominal   Peds  Hematology negative hematology ROS (+)   Anesthesia Other Findings   Reproductive/Obstetrics (+) Pregnancy                          Anesthesia Physical Anesthesia Plan  ASA: II  Anesthesia Plan: Spinal   Post-op Pain Management:    Induction:   Airway Management Planned: Natural Airway  Additional Equipment:   Intra-op Plan:   Post-operative Plan:   Informed Consent: I have reviewed the patients History and Physical, chart, labs and discussed the procedure including the risks, benefits and alternatives for the proposed anesthesia with the patient or authorized representative who has indicated his/her understanding and acceptance.     Plan Discussed with: CRNA, Anesthesiologist and Surgeon  Anesthesia Plan Comments:         Anesthesia Quick Evaluation

## 2012-10-04 NOTE — Anesthesia Postprocedure Evaluation (Signed)
  Anesthesia Post-op Note  Patient: Marissa Acevedo  Procedure(s) Performed: Procedure(s) with comments: Primary CESAREAN SECTION (N/A) - EDD: 10/09/12  Patient Location: PACU  Anesthesia Type:Spinal  Level of Consciousness: awake, alert  and oriented  Airway and Oxygen Therapy: Patient Spontanous Breathing  Post-op Pain: none  Post-op Assessment: Post-op Vital signs reviewed, Patient's Cardiovascular Status Stable, Respiratory Function Stable, Patent Airway, No signs of Nausea or vomiting, Pain level controlled, No headache and No backache  Post-op Vital Signs: Reviewed and stable  Complications: No apparent anesthesia complications

## 2012-10-04 NOTE — Anesthesia Procedure Notes (Signed)
Spinal  Patient location during procedure: OR Start time: 10/04/2012 9:14 AM Staffing Anesthesiologist: Kenyan Karnes A. Performed by: anesthesiologist  Preanesthetic Checklist Completed: patient identified, site marked, surgical consent, pre-op evaluation, timeout performed, IV checked, risks and benefits discussed and monitors and equipment checked Spinal Block Patient position: sitting Prep: site prepped and draped and DuraPrep Patient monitoring: heart rate, cardiac monitor, continuous pulse ox and blood pressure Approach: midline Location: L3-4 Injection technique: single-shot Needle Needle type: Sprotte  Needle gauge: 24 G Needle length: 9 cm Assessment Sensory level: T4 Additional Notes Patient tolerated procedure well. Adequate sensory level.

## 2012-10-04 NOTE — Op Note (Signed)
Cesarean Section Procedure Note Marissa Acevedo 10/04/2012  Indications: Breech Presentation, declined version  Pre-operative Diagnosis: Homero Fellers Breech at 39wks. 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 The risks, benefits, complications, treatment options, and expected outcomes were discussed with the patient. The patient concurred with the proposed plan, giving informed consent. identified as Nanette Wirsing and the procedure verified as C-Section Delivery. A Time Out was held and the above information confirmed.  After induction of anesthesia, the patient was draped and prepped in the usual sterile manner. Foley catheter was placed. A Pfannenstiel Incision was made and carried down through the subcutaneous tissue to the fascia. Fascial incision was made and extended transversely. The fascia was separated from the underlying rectus tissue superiorly and inferiorly. The peritoneum was identified and entered. Peritoneal incision was extended longitudinally. The utero-vesical peritoneal reflection was incised transversely and the bladder flap was bluntly freed from the lower uterine segment. A low transverse uterine incision was made. Clear amniotic fluid drained. Delivered from frank breech position by complete Breech extraction was healthy female infant at 9.36 am. Two tight nuchal cords released after head delivery, cord clamped and cut and baby handed to NICU team. Apgar scores of 8 at one minute and 9 at five minutes. Cord ph was not sent, cord blood was obtained for evaluation after cord blood banking. The placenta was removed Intact and appeared normal. The uterine outline, tubes and ovaries appeared normal. The uterine incision was closed with running locked sutures of in  two layers. Hemostasis was observed. Peritoneal closure done with 2-0 Vicryl. The fascia was then reapproximated with running sutures of 0Vicryl. The subcuticular closure was performed using 2-0plain gut. The skin was closed with 4-0Vicryl. Sterile dressing placed after steristrips. Instrument, sponge, and needle counts were correct prior the abdominal closure and were correct at the conclusion of the case.   Findings: FEMALE infant was delivered at 9.36 am on 10/04/12 by complete breech extraction with APGARs 8 and 9 at 1 and 5 minutes. Two nuchal cords reduced after head delivery. Baby received by NICU team and will be further evaluated by Peds team. Normal uterus, both tubes and ovaries. Normal 3 vessel cord.    Estimated Blood Loss: 700 cc  Total IV Fluids: 2400 ml LR  Urine Output: 200CC OF clear urine  Specimens: cord blood  Complications: no complications  Disposition: PACU - hemodynamically stable.   Maternal Condition: stable   Baby condition / location:  nursery-stable  Attending Attestation: I performed the procedure.   Signed: Surgeon(s): Robley Fries, MD

## 2012-10-04 NOTE — Anesthesia Postprocedure Evaluation (Signed)
  Anesthesia Post-op Note  Patient: Marissa Acevedo  Procedure(s) Performed: Procedure(s) with comments: Primary CESAREAN SECTION (N/A) - EDD: 10/09/12  Patient Location: Mother/Baby  Anesthesia Type:Spinal  Level of Consciousness: awake, alert , oriented and patient cooperative  Airway and Oxygen Therapy: Patient Spontanous Breathing  Post-op Pain: mild  Post-op Assessment: Patient's Cardiovascular Status Stable, Respiratory Function Stable, Patent Airway, No signs of Nausea or vomiting, Adequate PO intake and Pain level controlled  Post-op Vital Signs: Reviewed and stable  Complications: No apparent anesthesia complications

## 2012-10-05 ENCOUNTER — Encounter (HOSPITAL_COMMUNITY): Payer: Self-pay | Admitting: Obstetrics & Gynecology

## 2012-10-05 LAB — CBC
HCT: 34.2 % — ABNORMAL LOW (ref 36.0–46.0)
MCHC: 34.2 g/dL (ref 30.0–36.0)
MCV: 91.7 fL (ref 78.0–100.0)
RBC: 3.73 MIL/uL — ABNORMAL LOW (ref 3.87–5.11)
WBC: 14.3 10*3/uL — ABNORMAL HIGH (ref 4.0–10.5)

## 2012-10-05 NOTE — Progress Notes (Signed)
POD # 1  Subjective: Pt reports feeling good/ Pain controlled with Motrin and Percocet Tolerating po/ /Voiding without problems/ No n/v/ Flatus present Activity: ad lib Bleeding is light Newborn info:  Information for the patient's newborn:  Marissa Acevedo, Marissa Acevedo [[id number]]  female  / Circumcision: planning/ Feeding: breast   Objective:  VS:  Filed Vitals:   10/04/12 1730 10/04/12 2330 10/05/12 0330 10/05/12 0430  BP: 127/79 113/56 127/71 128/73  Pulse: 64 68 65 66  Temp: 99.1 F (37.3 C) 98.4 F (36.9 C) 97.6 F (36.4 C) 98.5 F (36.9 C)  TempSrc: Oral Oral Oral Oral  Resp: 18 18 18 18   Height:      Weight:      SpO2: 98% 98% 98% 95%     I&O: Intake/Output     09/29 0701 - 09/30 0700 09/30 0701 - 10/01 0700   P.O. 350    I.V. (mL/kg) 3000 (35.9)    Total Intake(mL/kg) 3350 (40.1)    Urine (mL/kg/hr) 2400    Blood 700    Total Output 3100     Net +250          Urine Occurrence 1 x       Recent Labs  10/05/12 0600  WBC 14.3*  HGB 11.7*  HCT 34.2*  PLT 186    Blood type: --/--/O POS, O POS (09/26 0920) Rubella: Immune (03/19 0000)    Physical Exam:  General: alert and cooperative CV: Regular rate and rhythm Resp: clear Abdomen: soft, nontender, normal bowel sounds Incision: covered with pressure dressing, c/d/i Uterine Fundus: firm, below umbilicus, nontender Lochia: minimal Ext: edema trace to feet and Homans sign is negative, no sign of DVT    Assessment: POD # 1/ G1P1001/ S/P C/Section d/t breech Doing well  Plan: Ambulate Continue routine post op orders   Signed: Donette Larry, N, MSN, CNM 10/05/2012, 9:05 AM

## 2012-10-06 NOTE — Progress Notes (Signed)
Patients nipples red and sore to touch. Assisted patient in latching infant on breast and patient complained of severe discomfort and started to cry. Lactation brought to bedside and nipple shield given and positioned on mothers nipple. Mother continued complain of severe discomfort. Mother given information regarding supplementation and pumping at that time patient and spouse instructed to call if assist needed.

## 2012-10-06 NOTE — Progress Notes (Addendum)
Patient ID: Marissa Acevedo, female   DOB: [birth year], 27 y.o.   MRN: [id number] Subjective: POD# 2 Information for the patient's newborn:  Cadience, Bradfield [[id number]]  female  / circ planning - delay circ per peds  Reports feeling well, sitting up in bed eating Feeding: breast and bottle Patient reports tolerating PO.  Breast symptoms: painful nipples - lactation advises pumping to establish supply and supplement with formula Pain controlled with ibuprofen (OTC) and narcotic analgesics including Percocet Denies HA/SOB/C/P/N/V/dizziness. Flatus present; (-) BM. She reports vaginal bleeding as normal, without clots.  She is ambulating, urinating without difficult.     Objective:   VS:  Filed Vitals:   10/05/12 0430 10/05/12 0910 10/05/12 1739 10/06/12 0545  BP: 128/73 126/65 121/71 126/83  Pulse: 66 71 71 67  Temp: 98.5 F (36.9 C) 98.1 F (36.7 C) 98 F (36.7 C) 98.8 F (37.1 C)  TempSrc: Oral Oral Oral Oral  Resp: 18 20 18 18   Height:      Weight:      SpO2: 95% 100%      No intake or output data in the 24 hours ending 10/06/12 0925      Recent Labs  10/05/12 0600  WBC 14.3*  HGB 11.7*  HCT 34.2*  PLT 186     Blood type: --/--/O POS, O POS (09/26 0920)  Rubella: Immune (03/19 0000)     Physical Exam:  General: alert, cooperative and no distress CV: Regular rate and rhythm, S1S2 present or without murmur or extra heart sounds Resp: clear Abdomen: soft, nontender, normal bowel sounds Incision: Tegaderm and Honeycomb dressing intact Uterine Fundus: firm, below umbilicus, nontender Lochia: minimal Ext: edema 1+, Homans sign is negative, no sign of DVT and no edema, redness or tenderness in the calves or thighs      Assessment/Plan: 27 y.o.   POD# 2.  s/p Cesarean Delivery.  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                Regular diet as tolerated Ambulate Routine post-op care Anticipate discharge tomorrow  Raelyn Mora, M, MSN, CNM 10/06/2012, 9:25 AM

## 2012-10-06 NOTE — Progress Notes (Signed)
Patient and spouse requested formula at this time and Similac given with written instructions on supplementation. Patient and spouse verbalized understanding information given. Mother also stated "my breast augmentation was done through the nipples and that may be the reason they are so sore when I nurse".

## 2012-10-07 MED ORDER — SENNOSIDES-DOCUSATE SODIUM 8.6-50 MG PO TABS: 2.0000 | ORAL_TABLET | Freq: Every day | ORAL | Status: AC | PRN

## 2012-10-07 MED ORDER — IBUPROFEN 600 MG PO TABS: 600.0000 mg | ORAL_TABLET | Freq: Four times a day (QID) | ORAL | Status: DC | PRN

## 2012-10-07 MED ORDER — OXYCODONE-ACETAMINOPHEN 5-325 MG PO TABS: 1.0000 | ORAL_TABLET | ORAL | Status: DC | PRN

## 2012-10-07 NOTE — Lactation Note (Addendum)
This note was copied from the chart of Marissa Acevedo. Lactation Consultation Note  Patient Name: Marissa Acevedo Acevedo Date: 10/07/2012 Reason for consult: Follow-up assessment;Breast/nipple pain;Difficult latch At this time Mom is pumping and bottle feeding and wants to keep this plan due to sore nipples. Last pumping this am she received 50+ ml of EBM. Mom has her own DEBP for home use. Mom reports she would like to get baby back to the breast when her nipples heal (she has bruising, cracking). Offered to schedule OP follow up. Mom will call if needed. Advised she needs to pump every 3 hours for 15-20 minutes to establish her milk supply, her breasts are filling. Engorgement care reviewed if needed. Care for sore nipples has been reviewed. Mom has comfort gels. Mom reports pumping is not uncomfortable.   Maternal Data    Feeding Feeding Type: Formula Nipple Type: Slow - flow  LATCH Score/Interventions          Comfort (Breast/Nipple): Filling, red/small blisters or bruises, mild/mod discomfort Problem noted: Cracked, bleeding, blisters, bruises Intervention(s): Double electric pump  Problem noted: Filling Interventions (Mild/moderate discomfort): Comfort gels        Lactation Tools Discussed/Used Tools: Pump;Comfort gels Nipple shield size: 20 Breast pump type: Double-Electric Breast Pump   Consult Status Consult Status: Complete Date: 10/07/12 Follow-up type: In-patient    Alfred Levins 10/07/2012, 10:48 AM

## 2012-10-07 NOTE — Discharge Summary (Signed)
Obstetric Discharge Summary Reason for Admission: G1 P0 @ 39wks for planned primary c/s d/t frank breech. Prenatal Procedures: NST, ultrasound and MFM u/s and f/u d/t lagging long bone growth; hypospadias Intrapartum Procedures: breech extraction and cesarean: low cervical, transverse Postpartum Procedures: none Complications-Operative and Postpartum: none Hemoglobin  Date Value Range Status  10/05/2012 11.7* 12.0 - 15.0 g/dL Final     HCT  Date Value Range Status  10/05/2012 34.2* 36.0 - 46.0 % Final    Physical Exam:  General: alert, cooperative and no distress Lochia: appropriate Uterine Fundus: firm Incision: healing well DVT Evaluation: No evidence of DVT seen on physical exam.  Discharge Diagnoses: G1 P1 s/p primary C/S on 10/05/12 d/t frank breech.  Infant with lagging long bone growth and hypospadias  Discharge Information: Date: 10/07/2012 Activity: pelvic rest Diet: routine Medications: PNV, Ibuprofen, Colace and Percocet Condition: stable Instructions: refer to practice specific booklet Discharge to: home Follow-up Information   Follow up with MODY,VAISHALI R, MD In 6 weeks.   Specialty:  Obstetrics and Gynecology   Contact information:   [address] [place] [address] [phone]       Newborn Data: Live born female on 10/05/12 Birth Weight: 6 lb 3.3 oz (2815 g) APGAR: 8, 9  Home with mother.  Rodneshia Greenhouse K 10/07/2012, 9:41 AM

## 2012-10-07 NOTE — Progress Notes (Signed)
Patient ID: Marissa Acevedo, female   DOB: 1985-09-01, 27 y.o.   MRN: [id number] POD # 3  Subjective: Pt reports feeling well and eager for early d/c home/ Pain controlled with ibuprofen and percocet Tolerating po/Voiding without problems/ No n/v/Flatus pos Activity: out of bed and ambulate Bleeding is light Newborn info:  Information for the patient's newborn:  Marissa Acevedo, Marissa Acevedo [[id number]]  female  / circ complete / Feeding: breast   Objective: VS: BP 112/72  Pulse 76  Temp 98.4 F (36.9 C) (Oral)  Resp 18    LABS:  Recent Labs  10/05/12 0600  WBC 14.3*  HGB 11.7*  PLT 186                             Physical Exam:  General: alert, cooperative and no distress CV: Regular rate and rhythm Resp: clear Abdomen: soft, nontender, normal bowel sounds Incision: Covered with Tegaderm and honeycomb dressing; well approximated. Uterine Fundus: firm, below umbilicus, nontender Lochia: minimal Ext: edema trace and Homans sign is negative, no sign of DVT    A/P: POD # 3/ G1P1001/ S/P C/Section d/t frank breech; Known lagging long bones and hypospadias Doing well and stable for discharge home RX's: Ibuprofen 600mg  po Q 6 hrs prn pain #30 Refill x 1 Percocet 5/325 1 - 2 tabs po every 6 hrs prn pain  #30 No refill Colace 100mg  po BID prn #30 Ref x 1 Rt pp visit in 6 wks.    Signed: Demetrius Revel, MSN, [facility] 10/07/2012, 8:59 AM

## 2012-10-08 NOTE — Discharge Summary (Signed)
Reviewed and agree with note and plan. V.Aleria Maheu, MD  

## 2013-11-07 ENCOUNTER — Encounter (HOSPITAL_COMMUNITY): Payer: Self-pay | Admitting: Obstetrics & Gynecology

## 2013-11-21 IMAGING — US US OB DETAIL+14 WK
1 series · 16 of 28 positions shown · non-contrast
Comparison: none

[Series 1: us ob detail+14 wk · 0.23mm/px · 82 acquisitions, 16 frames shown]
[im 1/82]
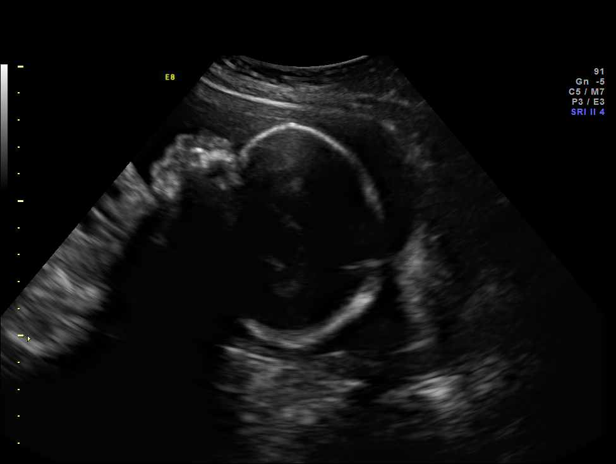
[im 7/82]
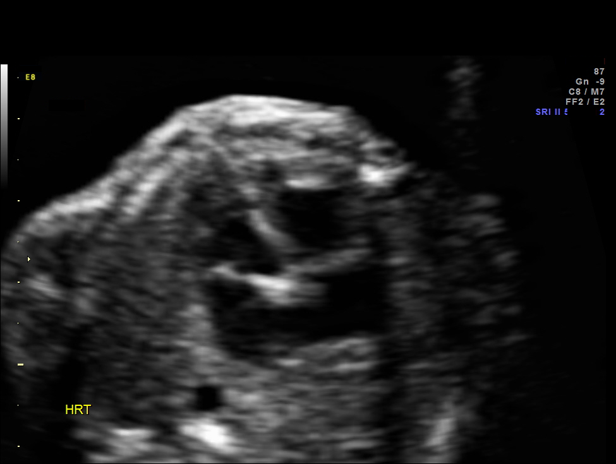
[im 13/82]
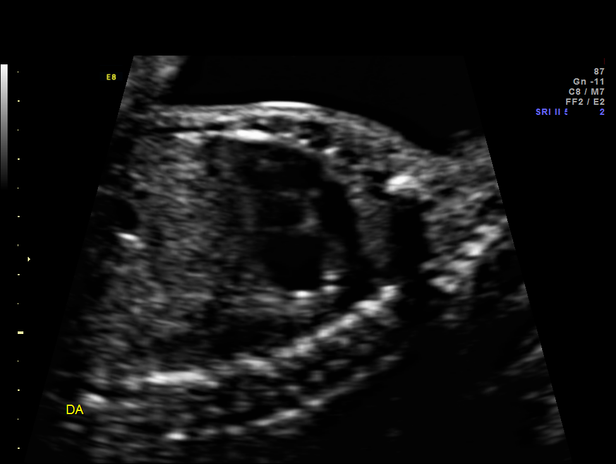
[im 19/82]
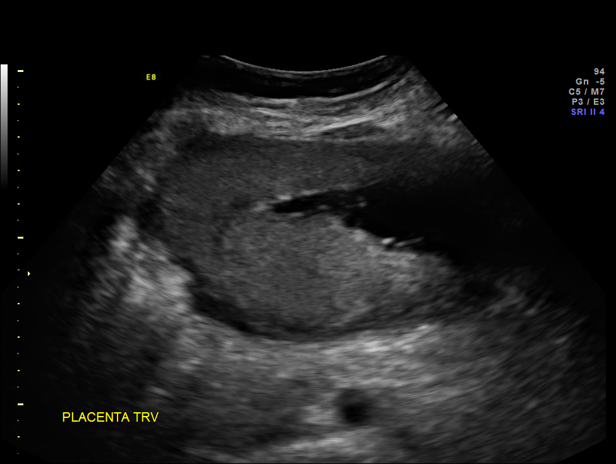
[im 22/82]
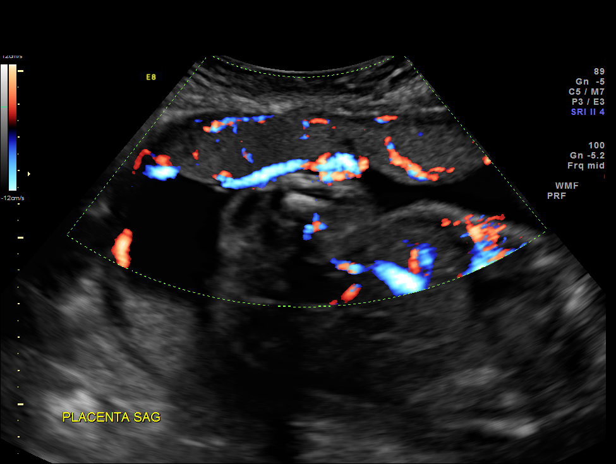
[im 28/82]
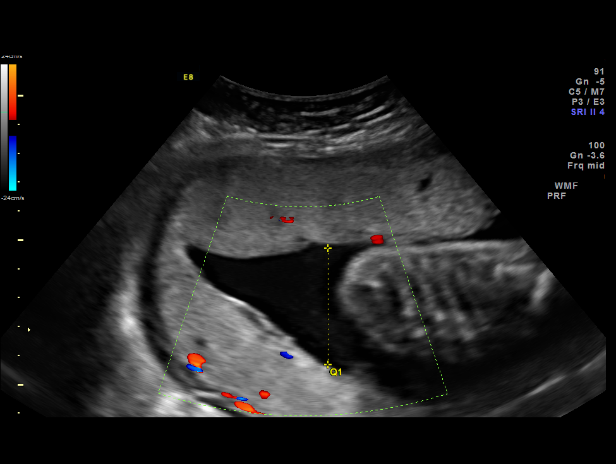
[im 34/82]
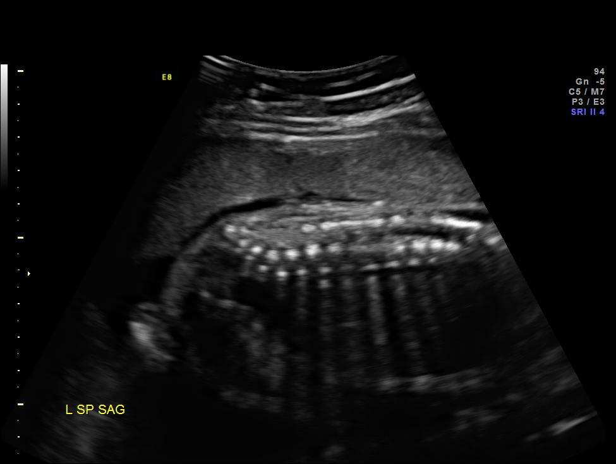
[im 40/82]
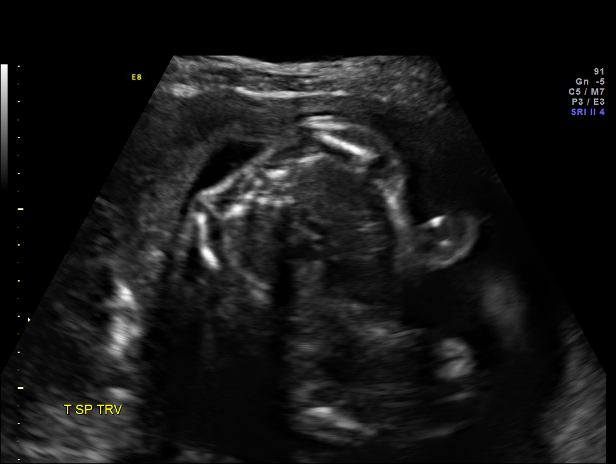
[im 43/82]
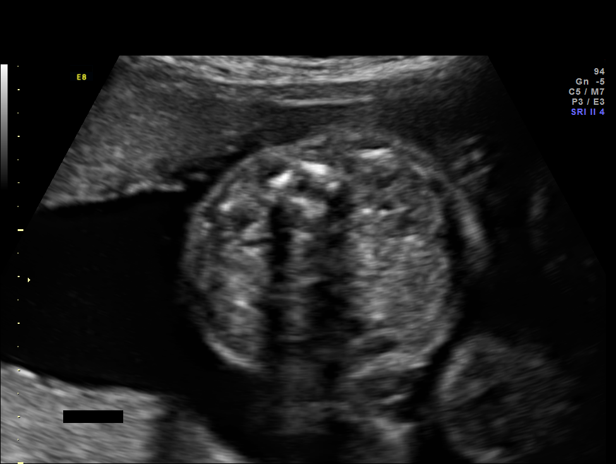
[im 49/82]
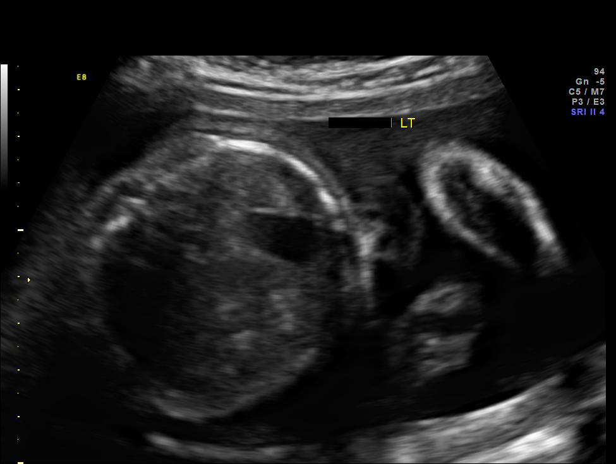
[im 55/82]
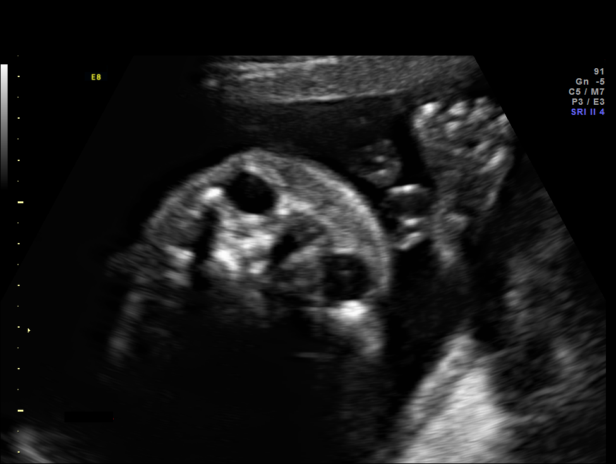
[im 61/82]
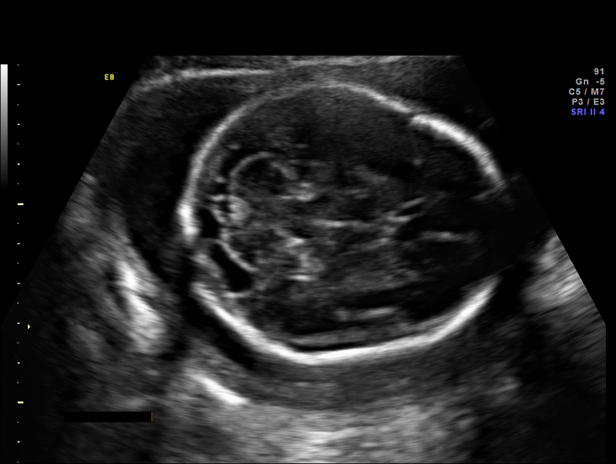
[im 64/82]
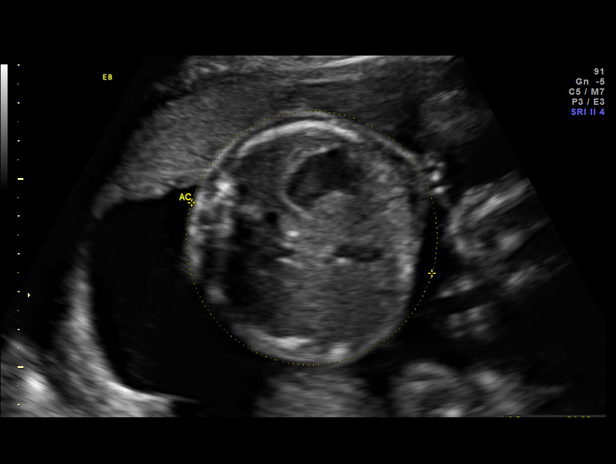
[im 70/82]
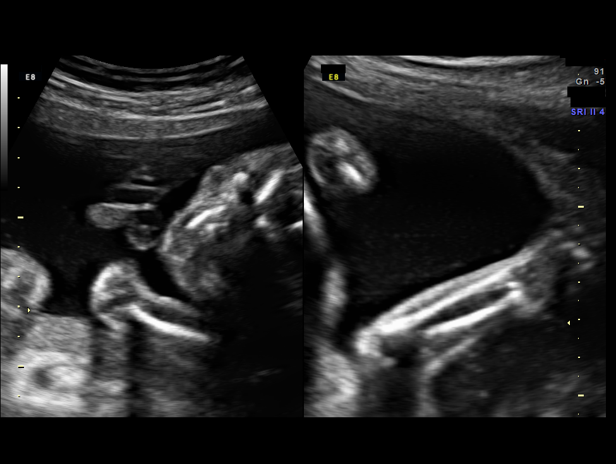
[im 76/82]
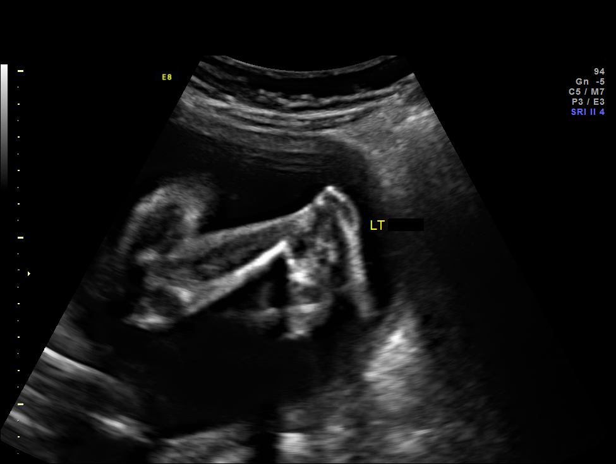
[im 82/82]
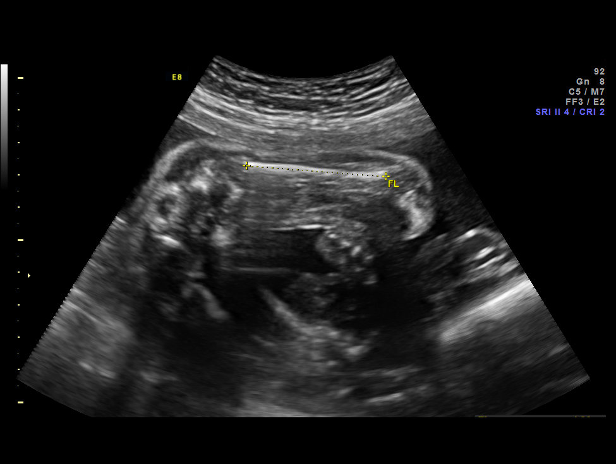

[16 of 28 positions shown; findings below may reference images not displayed]

OBSTETRICS REPORT
                      (Signed Final 07/07/2012 [DATE])

Service(s) Provided

 US OB DETAIL + 14 WK                                  76811.0
Indications

 Detailed fetal anatomic survey
 Shortened long bones - Lower
 Polycystic Ovarian Syndrome
Fetal Evaluation

 Num Of Fetuses:    1
 Fetal Heart Rate:  147                          bpm
 Cardiac Activity:  Observed
 Presentation:      Cephalic
 Placenta:          Right lateral, above
                    cervical os
 P. Cord            Visualized
 Insertion:

 Amniotic Fluid
 AFI FV:      Subjectively increased
 AFI Sum:     23.98   cm       97  %Tile     Larg Pckt:    7.93  cm
 RUQ:   4.04    cm   RLQ:    7.93   cm    LUQ:   4.89    cm   LLQ:    7.12   cm
Biometry

 BPD:     69.3  mm     G. Age:  27w 6d                CI:         81.1   70 - 86
 OFD:     85.5  mm                                    FL/HC:      17.0   18.6 -

 HC:     248.8  mm     G. Age:  27w 0d       39  %    HC/AC:      1.19   1.05 -

 AC:     209.9  mm     G. Age:  25w 4d       15  %    FL/BPD:     61.0   71 - 87
 FL:      42.3  mm     G. Age:  23w 6d      < 3  %    FL/AC:      20.2   20 - 24
 HUM:     40.9  mm     G. Age:  24w 6d      < 5  %
 CER:     29.9  mm     G. Age:  26w 3d       47  %
 HUM:     40.7  mm     G. Age:  24w 5d      < 5  %
 FL:      42.3  mm     G. Age:  23w 6d      < 3  %
 ULN:     36.8  mm     G. Age:  24w 4d      < 5  %
 TIB:     38.1  mm     G. Age:  24w 3d      < 5  %
 RAD:     32.1  mm     G. Age:  23w 0d        8  %
 FIB:     36.5  mm     G. Age:  23w 5d       25  %

 Est. FW:     778  gm    1 lb 11 oz      24  %
Gestational Age

 LMP:           26w 4d        Date:  01/03/12                 EDD:   10/09/12
 U/S Today:     26w 1d                                        EDD:   10/12/12
 Best:          26w 4d     Det. By:  LMP  (01/03/12)          EDD:   10/09/12
Anatomy

 Cranium:          Appears normal         Aortic Arch:      Appears normal
 Fetal Cavum:      Appears normal         Ductal Arch:      Appears normal
 Ventricles:       Appears normal         Diaphragm:        Appears normal
 Choroid Plexus:   Appears normal         Stomach:          Appears normal
 Cerebellum:       Appears normal         Abdomen:          Appears normal
 Posterior Fossa:  Appears normal         Abdominal Wall:   Appears nml (cord
                                                            insert, abd wall)
 Nuchal Fold:      Not applicable (>20    Cord Vessels:     Appears normal (3
                   wks GA)                                  vessel cord)
 Face:             Appears normal         Kidneys:          Appear normal
                   (orbits and profile)
 Lips:             Appears normal         Bladder:          Appears normal
 Heart:            Appears normal         Spine:            Appears normal
                   (4CH, axis, and
                   situs)
 RVOT:             Appears normal         Lower             Visualized
                                          Extremities:
 LVOT:             Appears normal         Upper             Visualized
                                          Extremities:

 Other:  Fetus appears to be a male. Heels and 5th digit visualized. Nasal
         bone visualized.
Targeted Anatomy

 Fetal Central Nervous System
 Cisterna Magna:
Cervix Uterus Adnexa

 Cervical Length:    3.2      cm

 Cervix:       Normal appearance by transabdominal scan.

 Left Ovary:    Within normal limits.
 Right Ovary:   Within normal limits.

 Adnexa:     No abnormality visualized.
Comments

 Ms. Herun was referred to the [facility] for shortened femurs.
 On today's ultrasound all of the fetal long bones are
 shortened, many of them measure less than the 5%.  This
 raises a concern for skeletal dysplasia, particularly
 achonodroplasia given the presentation late in pregnancy.
 However, several characteristic findings of achondroplasia,
 such as frontal bossing and macrocrania, are not present
 making the diagnosis less likely.  Amniocentesis with genetic
 testing for achondroplasia and other skeletal dysplasia would
 help clarify the diagnosis, but carries a risk of PPROM and
 preterm delivery.  As such Ms. Herun is not interested in
 amniocentesis at this time.  She will return to the [facility] in 4
 weeks for a repeat ultrasound where we will again reassess
 the fetal long bones and other anatomy.
Impression

 Single living intrauterine pregnancy at 26 weeks 4 days.
 Appropriate fetal growth (24%).
 Normal amniotic fluid volume.
 Shortened long bones.
 Otherwise normal fetal anatomy.
Recommendations

 Recommend follow-up ultrasound examination in 4 weeks.

 questions or concerns.
                Causey, Neta

## 2013-12-19 IMAGING — US US OB FOLLOW-UP
1 series · 12 of 28 positions shown · non-contrast
Comparison: none

[Series 1: us ob follow-up · 0.23mm/px · 12 of 44 slices shown]
[im 2/44]
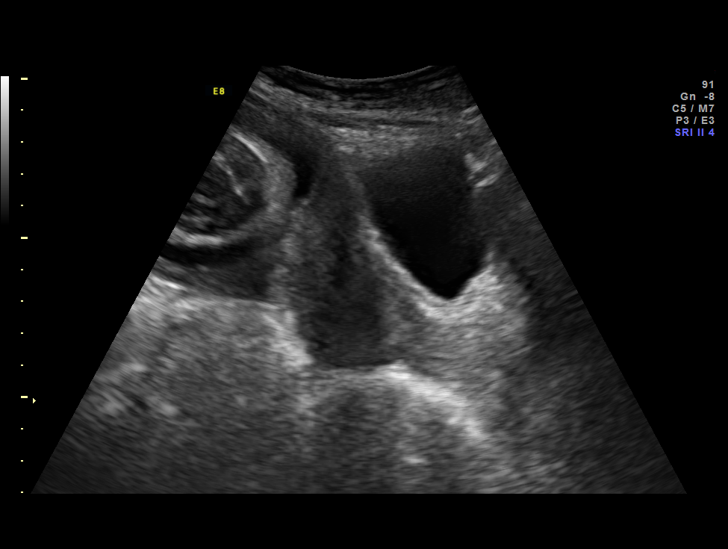
[im 5/44]
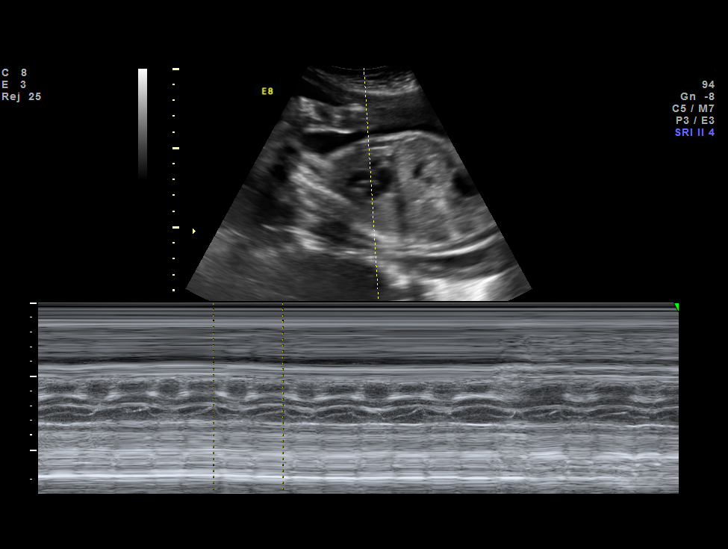
[im 8/44]
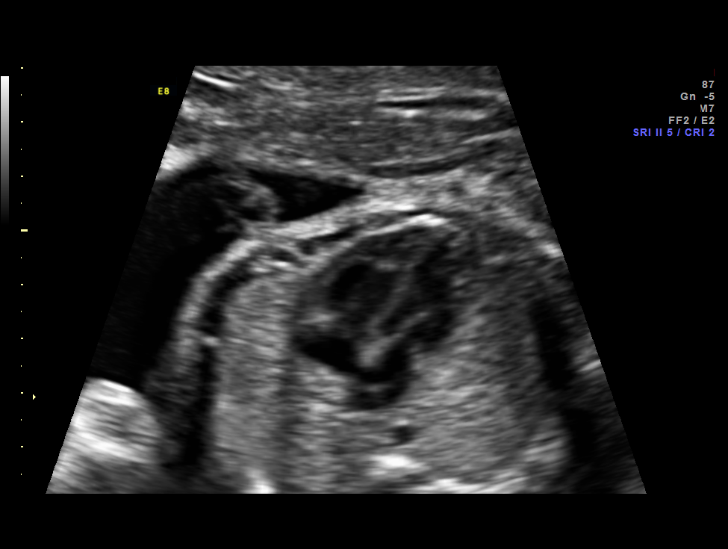
[im 13/44]
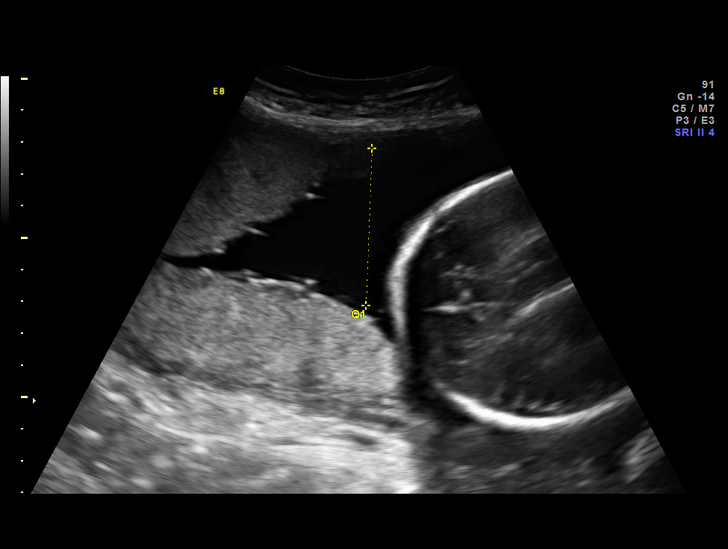
[im 16/44]
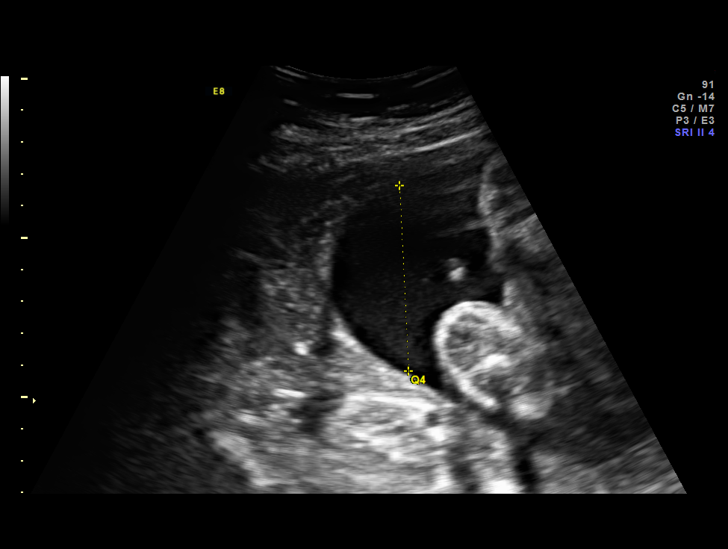
[im 20/44]
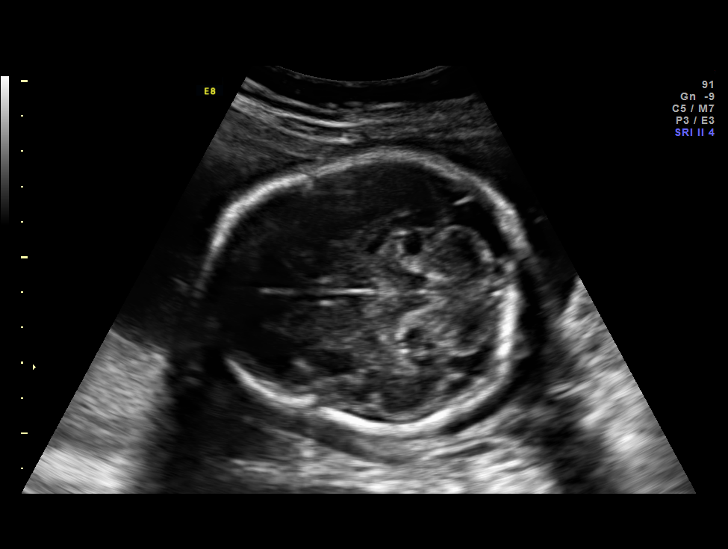
[im 24/44]
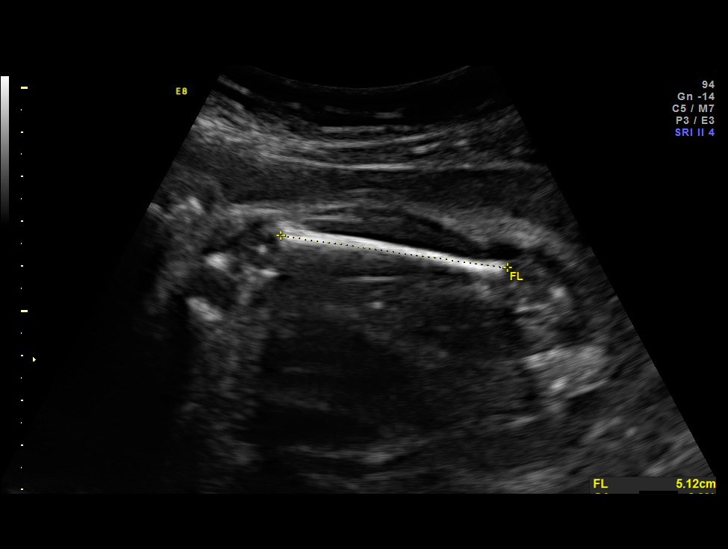
[im 28/44]
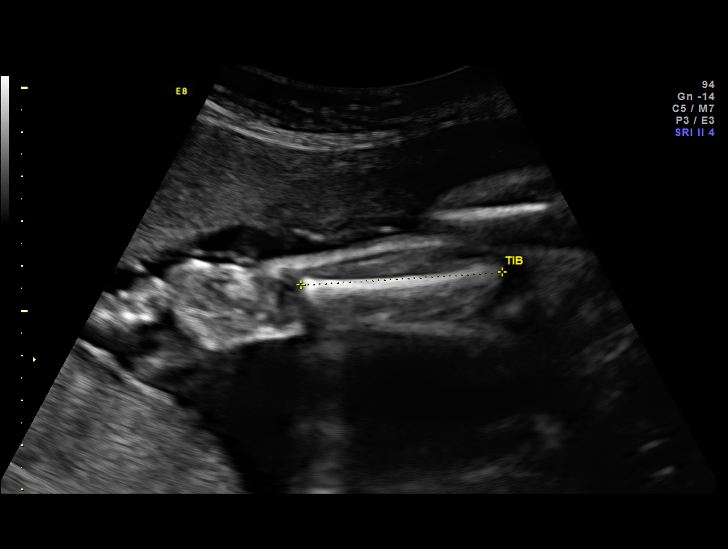
[im 31/44]
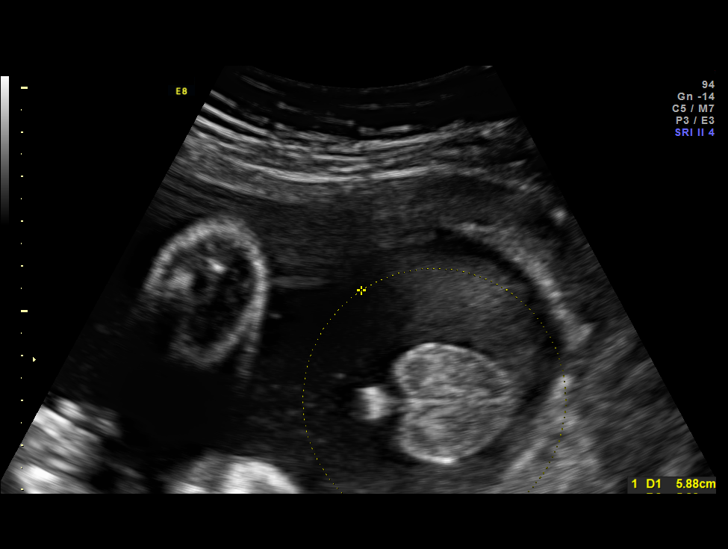
[im 36/44]
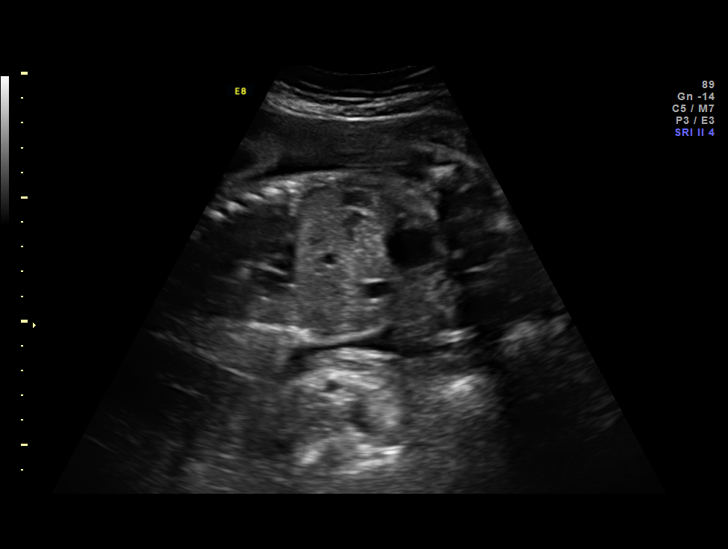
[im 39/44]
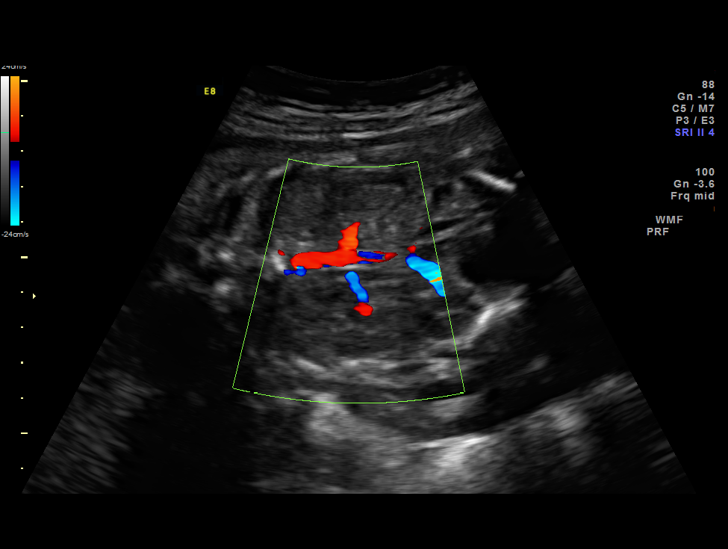
[im 42/44]
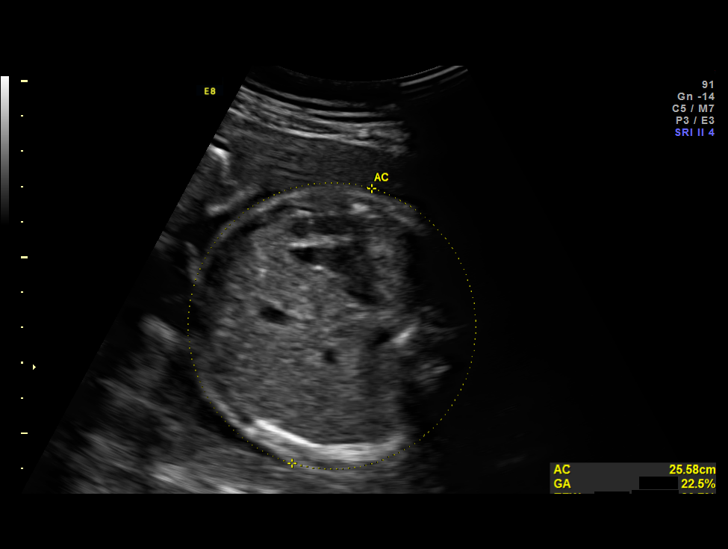

[12 of 28 positions shown; findings below may reference images not displayed]

OBSTETRICS REPORT
                      (Signed Final 08/04/2012 [DATE])

Service(s) Provided

 US OB FOLLOW UP                                       76816.1
Indications

 Shortened long bones - Lower
 Polycystic Ovarian Syndrome
Fetal Evaluation

 Num Of Fetuses:    1
 Fetal Heart Rate:  145                          bpm
 Cardiac Activity:  Observed
 Presentation:      Breech
 Placenta:          Right lateral, above
                    cervical os
 P. Cord            Previously Visualized
 Insertion:

 Amniotic Fluid
 AFI FV:      Subjectively within normal limits
 AFI Sum:     18.98   cm       72  %Tile     Larg Pckt:    5.83  cm
 RUQ:   4.94    cm   RLQ:    5.83   cm    LUQ:   5.5     cm   LLQ:    2.71   cm
Biometry

 BPD:       81  mm     G. Age:  32w 4d                CI:         79.1   70 - 86
 OFD:    102.4  mm                                    FL/HC:      17.6   19.3 -

 HC:     290.1  mm     G. Age:  31w 6d       53  %    HC/AC:      1.14   0.96 -

 AC:     254.4  mm     G. Age:  29w 4d       20  %    FL/BPD:     63.1   71 - 87
 FL:      51.1  mm     G. Age:  27w 2d      < 3  %    FL/AC:      20.1   20 - 24
 HUM:     47.7  mm     G. Age:  28w 0d      < 5  %
 ULN:     38.8  mm     G. Age:  25w 4d      < 5  %
 TIB:     45.1  mm     G. Age:  27w 4d      < 5  %
 RAD:     42.8  mm     G. Age:  29w 6d       46  %
 FIB:     46.4  mm     G. Age:  28w 5d       44  %

 Est. FW:    9609  gm           3 lb     28  %
Gestational Age

 LMP:           30w 4d        Date:  01/03/12                 EDD:   10/09/12
 U/S Today:     30w 2d                                        EDD:   10/11/12
 Best:          30w 4d     Det. By:  LMP  (01/03/12)          EDD:   10/09/12
Anatomy

 Cranium:          Appears normal         Aortic Arch:      Previously seen
 Fetal Cavum:      Appears normal         Ductal Arch:      Previously seen
 Ventricles:       Appears normal         Diaphragm:        Appears normal
 Choroid Plexus:   Previously seen        Stomach:          Appears normal, left
                                                            sided
 Cerebellum:       Appears normal         Abdomen:          Appears normal
 Posterior Fossa:  Appears normal         Abdominal Wall:   Appears nml (cord
                                                            insert, abd wall)
 Nuchal Fold:      Not applicable (>20    Cord Vessels:     Appears normal (3
                   wks GA)                                  vessel cord)
 Face:             Orbits and profile     Kidneys:          Appear normal
                   previously seen
 Lips:             Previously seen        Bladder:          Appears normal
 Heart:            Appears normal         Spine:            Previously seen
                   (4CH, axis, and
                   situs)
 RVOT:             Previously seen        Lower             Previously seen
                                          Extremities:
 LVOT:             Previously seen        Upper             Previously seen
                                          Extremities:

 Other:  Male gender. Heels and 5th digit previously seen.
Cervix Uterus Adnexa

 Cervix:       Normal appearance by transabdominal scan. Appears
               closed, without funnelling.
Impression

 IUP at 30+4 weeks
 Some long bones lag by 2-3 weeks; no stigmata of any
 particular skeletal dysplasia appreciated; most likely, familial
 and normal for this fetus
 Normal interval anatomy; anatomic survey complete
 Normal amniotic fluid volume
 Appropriate interval growth with EFW at the 28th %tile
Recommendations

 Follow-up ultrasound for growth in 4 weeks

 questions or concerns.

## 2014-02-06 IMAGING — US US OB FOLLOW-UP
1 series · 12 of 28 positions shown · non-contrast
Comparison: none

[Series 1: us ob follow-up · 0.23mm/px · 12 of 42 slices shown]
[im 2/42]
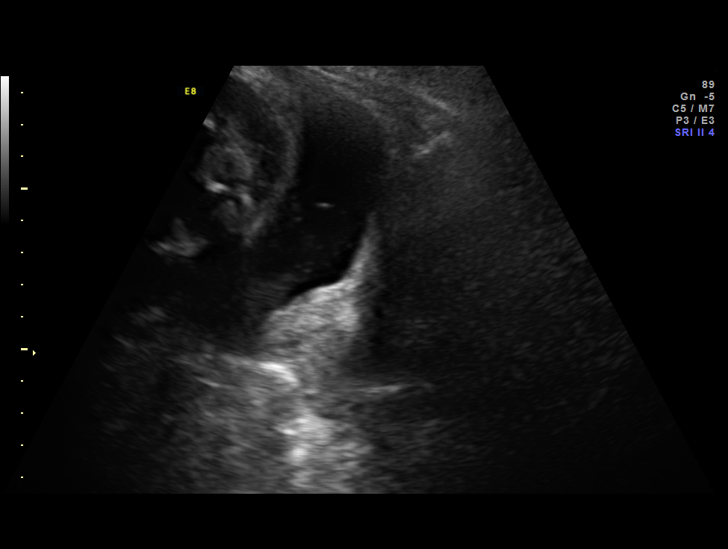
[im 5/42]
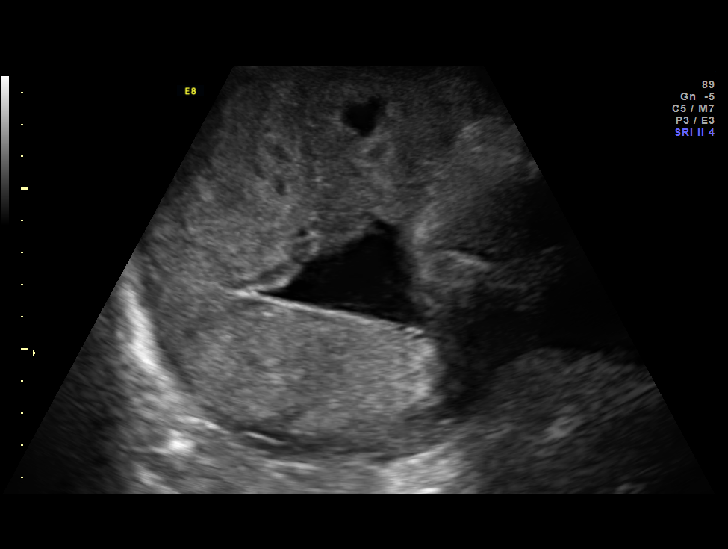
[im 8/42]
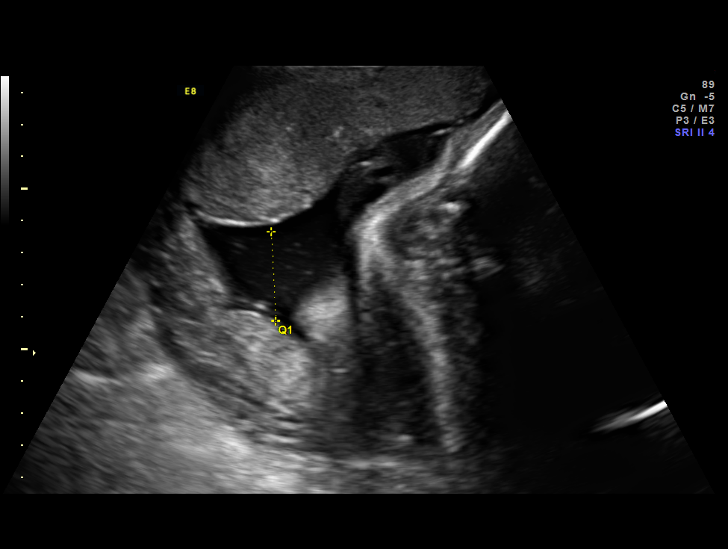
[im 13/42]
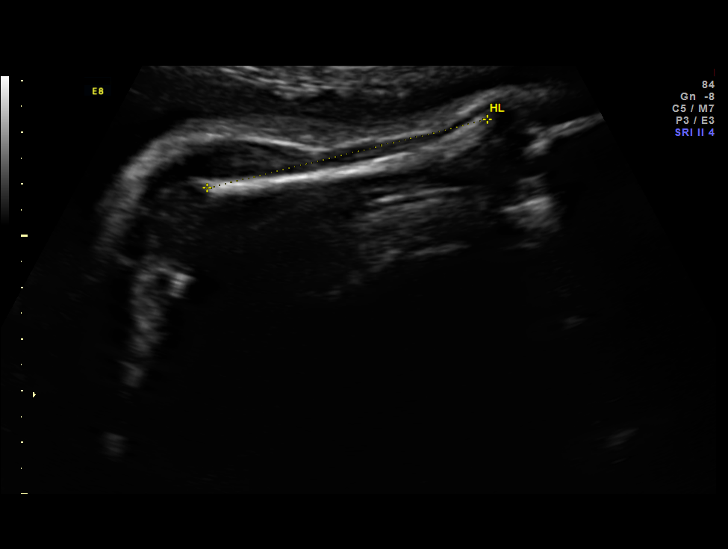
[im 16/42]
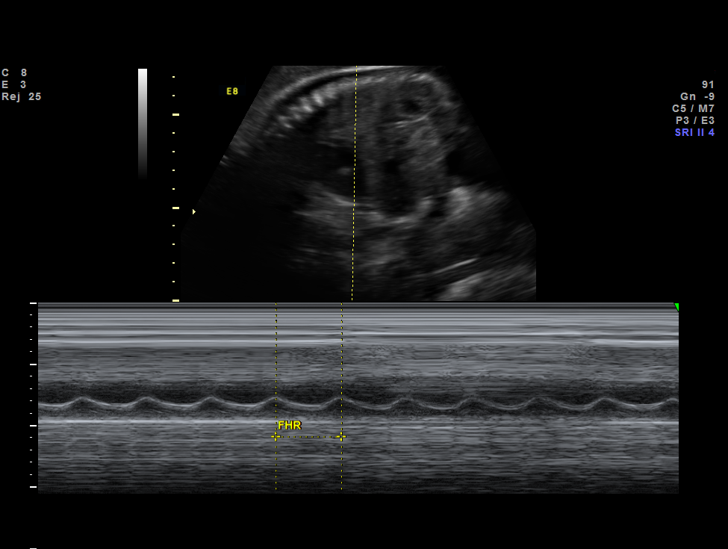
[im 19/42]
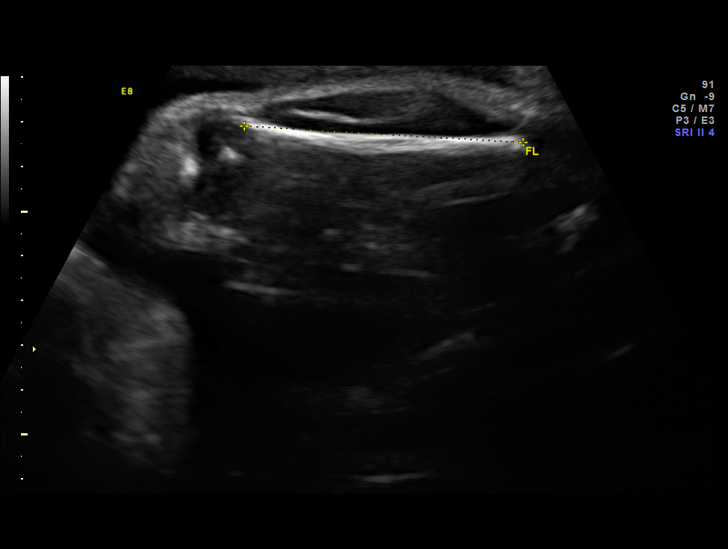
[im 23/42]
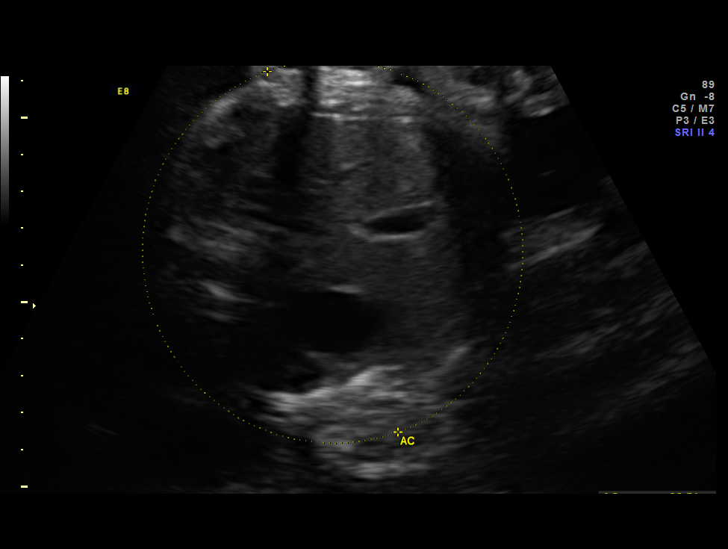
[im 26/42]
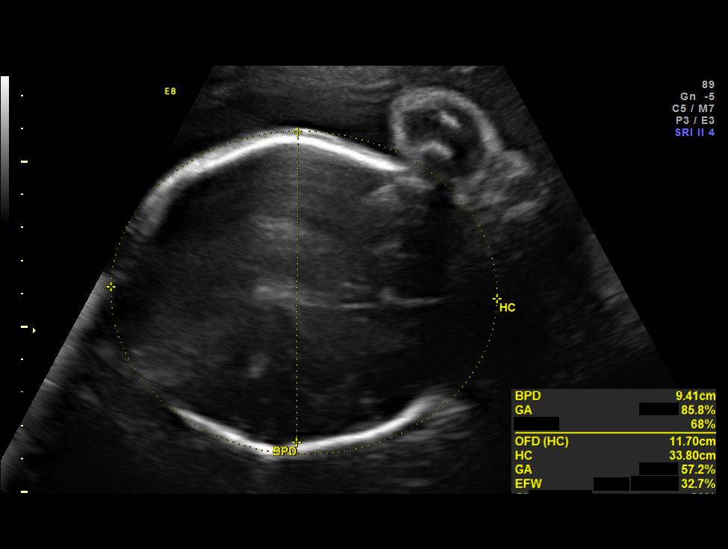
[im 29/42]
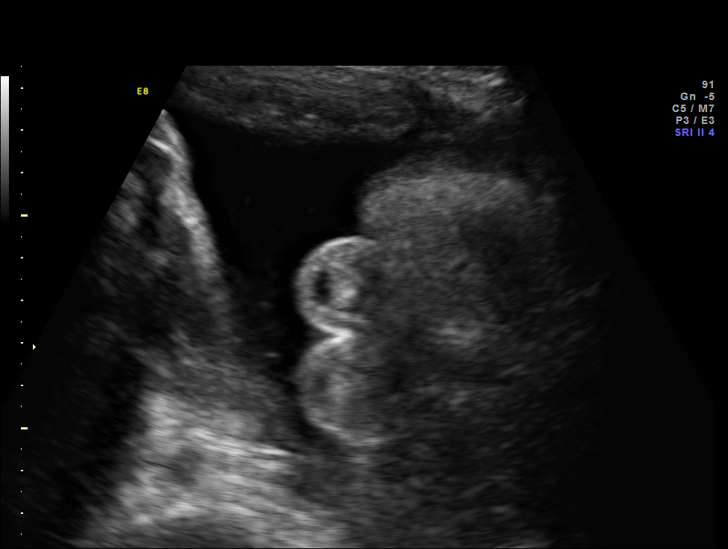
[im 34/42]
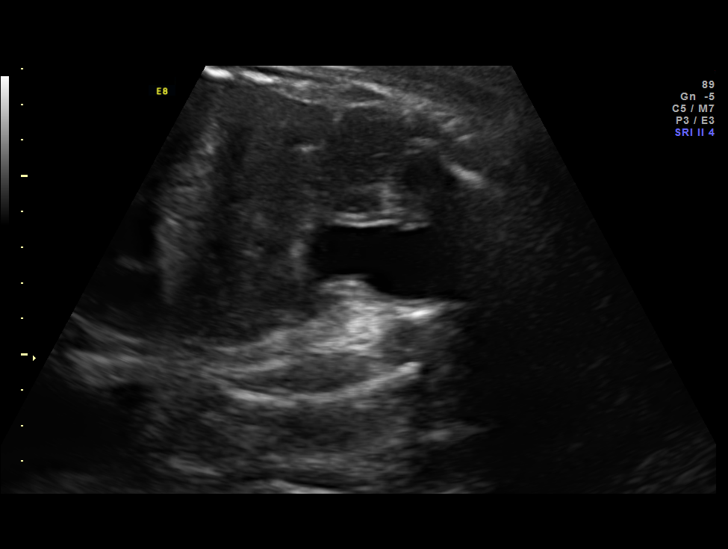
[im 37/42]
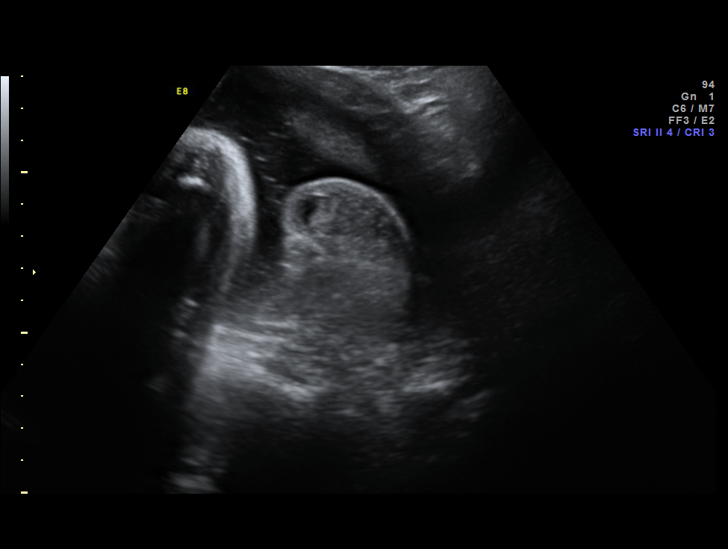
[im 40/42]
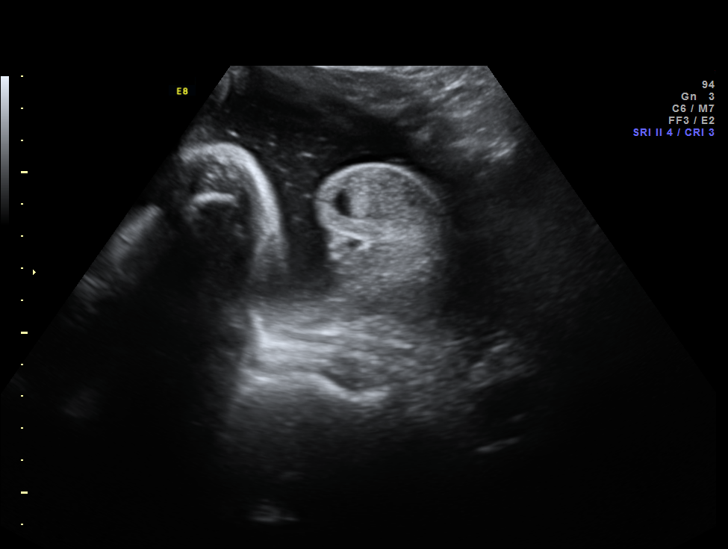

[12 of 28 positions shown; findings below may reference images not displayed]

OBSTETRICS REPORT
                      (Signed Final 09/22/2012 [DATE])

Service(s) Provided

 US OB FOLLOW UP                                       76816.1
Indications

 Shortened long bones - Lower
 Polycystic Ovarian Syndrome
Fetal Evaluation

 Num Of Fetuses:    1
 Fetal Heart Rate:  153                          bpm
 Cardiac Activity:  Observed
 Presentation:      Breech
 Placenta:          Right lateral, above
                    cervical os
 P. Cord            Previously Visualized
 Insertion:

 Amniotic Fluid
 AFI FV:      Subjectively within normal limits
 AFI Sum:     19.02   cm       74  %Tile     Larg Pckt:    6.74  cm
 RUQ:   2.79    cm   RLQ:    5.66   cm    LUQ:   3.83    cm   LLQ:    6.74   cm
Biometry

 BPD:     93.7  mm     G. Age:  38w 1d                CI:         80.0   70 - 86
 OFD:    117.1  mm                                    FL/HC:      18.8   20.9 -

 HC:     335.4  mm     G. Age:  38w 3d       47  %    HC/AC:      1.04   0.92 -

 AC:     323.2  mm     G. Age:  36w 2d       28  %    FL/BPD:     67.4   71 - 87
 FL:      63.2  mm     G. Age:  32w 5d      < 3  %    FL/AC:      19.6   20 - 24
 HUM:     55.9  mm     G. Age:  32w 4d      < 5  %
 ULN:     49.5  mm     G. Age:  31w 2d      < 5  %
 TIB:     54.1  mm     G. Age:  32w 0d      < 5  %
 RAD:     43.1  mm     G. Age:  30w 1d      < 5  %
 FIB:     53.8  mm     G. Age:  32w 6d      < 5  %

 Est. FW:    9363  gm      6 lb 1 oz     31  %
Gestational Age

 LMP:           37w 4d        Date:  01/03/12                 EDD:   10/09/12
 U/S Today:     36w 3d                                        EDD:   10/17/12
 Best:          37w 4d     Det. By:  LMP  (01/03/12)          EDD:   10/09/12
Anatomy

 Cranium:          Previously seen        Aortic Arch:      Previously seen
 Fetal Cavum:      Previously seen        Ductal Arch:      Previously seen
 Ventricles:       Previously seen        Diaphragm:        Previously seen
 Choroid Plexus:   Previously seen        Stomach:          Appears normal, left
                                                            sided
 Cerebellum:       Previously seen        Abdomen:          Previously seen
 Posterior Fossa:  Previously seen        Abdominal Wall:   Previously seen
 Nuchal Fold:      Not applicable (>20    Cord Vessels:     Previously seen
                   wks GA)
 Face:             Orbits and profile     Kidneys:          Appear normal
                   previously seen
 Lips:             Previously seen        Bladder:          Appears normal
 Heart:            Previously seen        Spine:            Previously seen
 RVOT:             Previously seen        Lower             Previously seen
                                          Extremities:
 LVOT:             Previously seen        Upper             Previously seen
                                          Extremities:

 Other:  Male gender. Heels and 5th digit previously seen.
Cervix Uterus Adnexa

 Cervix:       Not visualized (advanced GA >17wks)
Comments

 Ms. Iliase returns for follow up due to shortened long bones.
 Again, all long bones appear shortened (< 5th %tile) but
 appear to have normal morphology -without bowing or
 obvious fractures.  No other features suggestive of skeletal
 dysplasia were noted (i.e. frontal blossing), but the fetal
 profile was unable to be evaluated today due to fetal
 presentation.   Overall fetal growth is appropriate.  On exam
 today, there is the fetal penis appears to be small and
 blunted- suggestive of hypospadias.  This is a new finding -
 reviewed images from earlier ultarsounds and this is not
 appreciated.

 The findings and limitations of the study were again reviewed
 with the patient.  Based on her late gestational age, would
 recommend that Peds be available evaluate the infant after
 delivery.  The couple is aware that if hypospadias is indeed
 confirmed after delivery, there may be a need for Peds
 urology evaluaton and possible surgical intervention.
Impression

 Single IUP at 37 [DATE] weeks
 Overall fetal growth is appropriate (31st %tile)
 All long bones are shortened (< 5th %tile), but no other
 stigmata associated with skeletal dysplasia were appreciated
 ? hypospadias (see comments above)
 Normal amniotic fluid volume
Recommendations

 See comments.
 Pediatric evalaution of the newborn after delivery
 Follow-up ultrasounds as clinically indicated.
 questions or concerns.

## 2014-03-15 LAB — OB RESULTS CONSOLE RPR: RPR: NONREACTIVE

## 2014-03-15 LAB — OB RESULTS CONSOLE ABO/RH: RH Type: POSITIVE

## 2014-03-15 LAB — OB RESULTS CONSOLE RUBELLA ANTIBODY, IGM: Rubella: IMMUNE

## 2014-03-15 LAB — OB RESULTS CONSOLE HIV ANTIBODY (ROUTINE TESTING): HIV: NONREACTIVE

## 2014-03-15 LAB — OB RESULTS CONSOLE HEPATITIS B SURFACE ANTIGEN: Hepatitis B Surface Ag: NEGATIVE

## 2014-03-15 LAB — OB RESULTS CONSOLE ANTIBODY SCREEN: ANTIBODY SCREEN: NEGATIVE

## 2014-07-24 ENCOUNTER — Other Ambulatory Visit: Payer: Self-pay | Admitting: Obstetrics & Gynecology

## 2014-09-29 ENCOUNTER — Encounter (HOSPITAL_COMMUNITY): Payer: Self-pay

## 2014-10-02 ENCOUNTER — Encounter (HOSPITAL_COMMUNITY)
Admission: RE | Admit: 2014-10-02 | Discharge: 2014-10-02 | Disposition: A | Discharge status: Home / Self Care | Payer: BLUE CROSS/BLUE SHIELD | Source: Ambulatory Visit | Attending: Obstetrics & Gynecology | Admitting: Obstetrics & Gynecology

## 2014-10-02 ENCOUNTER — Encounter (HOSPITAL_COMMUNITY): Payer: Self-pay

## 2014-10-02 LAB — CBC
HCT: 38.7 % (ref 36.0–46.0)
Hemoglobin: 12.9 g/dL (ref 12.0–15.0)
MCH: 31.2 pg (ref 26.0–34.0)
MCHC: 33.3 g/dL (ref 30.0–36.0)
MCV: 93.7 fL (ref 78.0–100.0)
PLATELETS: 225 10*3/uL (ref 150–400)
RBC: 4.13 MIL/uL (ref 3.87–5.11)
RDW: 13.8 % (ref 11.5–15.5)
WBC: 9.2 10*3/uL (ref 4.0–10.5)

## 2014-10-02 LAB — TYPE AND SCREEN
ABO/RH(D): O POS
Antibody Screen: NEGATIVE

## 2014-10-02 NOTE — Patient Instructions (Signed)
Your procedure is scheduled on:10/03/14  Enter through the Main Entrance at :6am  Pick up desk phone and dial [phone] and inform us of your arrival.  Please call [phone] if you have any problems the morning of surgery.  Remember: Do not eat food or drink liquids after midnight:tonight Water ok until 3:30 am Tuesday   You may brush your teeth the morning of surgery.   DO NOT wear jewelry, eye make-up, lipstick,body lotion, or dark fingernail polish.  (Polished toes are ok) You may wear deodorant.  If you are to be admitted after surgery, leave suitcase in car until your room has been assigned. Patients discharged on the day of surgery will not be allowed to drive home. Wear loose fitting, comfortable clothes for your ride home.

## 2014-10-02 NOTE — Anesthesia Preprocedure Evaluation (Addendum)
Anesthesia Evaluation  Patient identified by MRN, date of birth, ID band Patient awake    Reviewed: Allergy & Precautions, NPO status , Patient's Chart, lab work & pertinent test results  History of Anesthesia Complications Negative for: history of anesthetic complications  Airway Mallampati: II  TM Distance: >3 FB Neck ROM: Full    Dental no notable dental hx. (+) Dental Advisory Given   Pulmonary neg pulmonary ROS,    Pulmonary exam normal breath sounds clear to auscultation       Cardiovascular negative cardio ROS Normal cardiovascular exam Rhythm:Regular Rate:Normal     Neuro/Psych negative neurological ROS  negative psych ROS   GI/Hepatic negative GI ROS, Neg liver ROS,   Endo/Other  obesity  Renal/GU negative Renal ROS  negative genitourinary   Musculoskeletal negative musculoskeletal ROS (+)   Abdominal   Peds negative pediatric ROS (+)  Hematology negative hematology ROS (+)   Anesthesia Other Findings   Reproductive/Obstetrics (+) Pregnancy                            Anesthesia Physical Anesthesia Plan  ASA: II  Anesthesia Plan: Spinal   Post-op Pain Management:    Induction:   Airway Management Planned:   Additional Equipment:   Intra-op Plan:   Post-operative Plan:   Informed Consent: I have reviewed the patients History and Physical, chart, labs and discussed the procedure including the risks, benefits and alternatives for the proposed anesthesia with the patient or authorized representative who has indicated his/her understanding and acceptance.   Dental advisory given  Plan Discussed with: CRNA  Anesthesia Plan Comments:         Anesthesia Quick Evaluation  

## 2014-10-03 ENCOUNTER — Inpatient Hospital Stay (HOSPITAL_COMMUNITY): Payer: BLUE CROSS/BLUE SHIELD | Admitting: Anesthesiology

## 2014-10-03 ENCOUNTER — Encounter (HOSPITAL_COMMUNITY): Payer: Self-pay | Admitting: *Deleted

## 2014-10-03 ENCOUNTER — Encounter (HOSPITAL_COMMUNITY)
Admission: RE | Disposition: A | Discharge status: Home / Self Care | Payer: Self-pay | Source: Ambulatory Visit | Attending: Obstetrics & Gynecology

## 2014-10-03 ENCOUNTER — Inpatient Hospital Stay (HOSPITAL_COMMUNITY)
Admission: RE | Admit: 2014-10-03 | Discharge: 2014-10-05 | DRG: 766 | Disposition: A | Discharge status: Home / Self Care | Payer: BLUE CROSS/BLUE SHIELD | Source: Ambulatory Visit | Attending: Obstetrics & Gynecology | Admitting: Obstetrics & Gynecology

## 2014-10-03 DIAGNOSIS — O874 Varicose veins of lower extremity in the puerperium: Secondary | ICD-10-CM | POA: Diagnosis present

## 2014-10-03 DIAGNOSIS — O4292 Full-term premature rupture of membranes, unspecified as to length of time between rupture and onset of labor: Secondary | ICD-10-CM | POA: Diagnosis present

## 2014-10-03 DIAGNOSIS — Z98891 History of uterine scar from previous surgery: Secondary | ICD-10-CM

## 2014-10-03 DIAGNOSIS — O3421 Maternal care for scar from previous cesarean delivery: Secondary | ICD-10-CM | POA: Diagnosis present

## 2014-10-03 DIAGNOSIS — E282 Polycystic ovarian syndrome: Secondary | ICD-10-CM | POA: Diagnosis present

## 2014-10-03 DIAGNOSIS — O99824 Streptococcus B carrier state complicating childbirth: Secondary | ICD-10-CM | POA: Diagnosis present

## 2014-10-03 DIAGNOSIS — Z3A39 39 weeks gestation of pregnancy: Secondary | ICD-10-CM | POA: Diagnosis present

## 2014-10-03 LAB — RPR: RPR Ser Ql: NONREACTIVE

## 2014-10-03 SURGERY — Surgical Case
Anesthesia: Spinal | Site: Abdomen

## 2014-10-03 MED ORDER — SIMETHICONE 80 MG PO CHEW
80.0000 mg | CHEWABLE_TABLET | ORAL | Status: DC
  Administered 2014-10-03 – 2014-10-04 (×2): 80 mg via ORAL
  Filled 2014-10-03 (×2): qty 1

## 2014-10-03 MED ORDER — SCOPOLAMINE 1 MG/3DAYS TD PT72
1.0000 | MEDICATED_PATCH | Freq: Once | TRANSDERMAL | Status: DC
  Administered 2014-10-03: 1.5 mg via TRANSDERMAL

## 2014-10-03 MED ORDER — OXYTOCIN 10 UNIT/ML IJ SOLN
INTRAMUSCULAR | Status: AC
  Filled 2014-10-03: qty 4

## 2014-10-03 MED ORDER — SCOPOLAMINE 1 MG/3DAYS TD PT72: 1.0000 | MEDICATED_PATCH | Freq: Once | TRANSDERMAL | Status: DC

## 2014-10-03 MED ORDER — BUPIVACAINE IN DEXTROSE 0.75-8.25 % IT SOLN
INTRATHECAL | Status: DC | PRN
  Administered 2014-10-03: 1.6 mL via INTRATHECAL

## 2014-10-03 MED ORDER — OXYCODONE-ACETAMINOPHEN 5-325 MG PO TABS
1.0000 | ORAL_TABLET | ORAL | Status: DC | PRN
  Administered 2014-10-05: 0.5 via ORAL
  Administered 2014-10-05: 1 via ORAL
  Filled 2014-10-03 (×2): qty 1

## 2014-10-03 MED ORDER — SENNOSIDES-DOCUSATE SODIUM 8.6-50 MG PO TABS
2.0000 | ORAL_TABLET | ORAL | Status: DC
  Administered 2014-10-03 – 2014-10-04 (×2): 2 via ORAL
  Filled 2014-10-03 (×2): qty 2

## 2014-10-03 MED ORDER — PHENYLEPHRINE 8 MG IN D5W 100 ML (0.08MG/ML) PREMIX OPTIME
INJECTION | INTRAVENOUS | Status: AC
  Filled 2014-10-03: qty 100

## 2014-10-03 MED ORDER — KETOROLAC TROMETHAMINE 30 MG/ML IJ SOLN: 30.0000 mg | Freq: Four times a day (QID) | INTRAMUSCULAR | Status: AC | PRN

## 2014-10-03 MED ORDER — ERYTHROMYCIN 5 MG/GM OP OINT
TOPICAL_OINTMENT | OPHTHALMIC | Status: AC
  Filled 2014-10-03: qty 1

## 2014-10-03 MED ORDER — MORPHINE SULFATE (PF) 0.5 MG/ML IJ SOLN
INTRAMUSCULAR | Status: AC
  Filled 2014-10-03: qty 100

## 2014-10-03 MED ORDER — DIPHENHYDRAMINE HCL 50 MG/ML IJ SOLN
12.5000 mg | INTRAMUSCULAR | Status: DC | PRN
  Administered 2014-10-03: 12.5 mg via INTRAVENOUS
  Filled 2014-10-03 (×2): qty 1

## 2014-10-03 MED ORDER — SIMETHICONE 80 MG PO CHEW: 80.0000 mg | CHEWABLE_TABLET | ORAL | Status: DC | PRN

## 2014-10-03 MED ORDER — ACETAMINOPHEN 325 MG PO TABS
650.0000 mg | ORAL_TABLET | ORAL | Status: DC | PRN
  Administered 2014-10-03 – 2014-10-05 (×7): 650 mg via ORAL
  Filled 2014-10-03 (×7): qty 2

## 2014-10-03 MED ORDER — LACTATED RINGERS IV SOLN
Freq: Once | INTRAVENOUS | Status: AC
  Administered 2014-10-03: 06:00:00 via INTRAVENOUS

## 2014-10-03 MED ORDER — SIMETHICONE 80 MG PO CHEW
80.0000 mg | CHEWABLE_TABLET | Freq: Three times a day (TID) | ORAL | Status: DC
  Administered 2014-10-03 – 2014-10-05 (×4): 80 mg via ORAL
  Filled 2014-10-03 (×4): qty 1

## 2014-10-03 MED ORDER — SODIUM CHLORIDE 0.9 % IJ SOLN: 3.0000 mL | INTRAMUSCULAR | Status: DC | PRN

## 2014-10-03 MED ORDER — DIPHENHYDRAMINE HCL 25 MG PO CAPS: 25.0000 mg | ORAL_CAPSULE | Freq: Four times a day (QID) | ORAL | Status: DC | PRN

## 2014-10-03 MED ORDER — BUPIVACAINE HCL (PF) 0.25 % IJ SOLN
INTRAMUSCULAR | Status: AC
  Filled 2014-10-03: qty 10

## 2014-10-03 MED ORDER — FENTANYL CITRATE (PF) 100 MCG/2ML IJ SOLN: 25.0000 ug | INTRAMUSCULAR | Status: DC | PRN

## 2014-10-03 MED ORDER — LANOLIN HYDROUS EX OINT: 1.0000 "application " | TOPICAL_OINTMENT | CUTANEOUS | Status: DC | PRN

## 2014-10-03 MED ORDER — ONDANSETRON HCL 4 MG/2ML IJ SOLN
INTRAMUSCULAR | Status: DC | PRN
  Administered 2014-10-03: 4 mg via INTRAVENOUS

## 2014-10-03 MED ORDER — NALBUPHINE HCL 10 MG/ML IJ SOLN
5.0000 mg | INTRAMUSCULAR | Status: DC | PRN
  Filled 2014-10-03: qty 0.5

## 2014-10-03 MED ORDER — CEFAZOLIN SODIUM-DEXTROSE 2-3 GM-% IV SOLR
2.0000 g | INTRAVENOUS | Status: AC
  Administered 2014-10-03: 2 g via INTRAVENOUS

## 2014-10-03 MED ORDER — FENTANYL CITRATE (PF) 100 MCG/2ML IJ SOLN
INTRAMUSCULAR | Status: AC
  Filled 2014-10-03: qty 4

## 2014-10-03 MED ORDER — LACTATED RINGERS IV SOLN: INTRAVENOUS | Status: DC

## 2014-10-03 MED ORDER — TETANUS-DIPHTH-ACELL PERTUSSIS 5-2.5-18.5 LF-MCG/0.5 IM SUSP: 0.5000 mL | Freq: Once | INTRAMUSCULAR | Status: DC

## 2014-10-03 MED ORDER — ZOLPIDEM TARTRATE 5 MG PO TABS: 5.0000 mg | ORAL_TABLET | Freq: Every evening | ORAL | Status: DC | PRN

## 2014-10-03 MED ORDER — NALOXONE HCL 1 MG/ML IJ SOLN
1.0000 ug/kg/h | INTRAVENOUS | Status: DC | PRN
  Filled 2014-10-03: qty 2

## 2014-10-03 MED ORDER — WITCH HAZEL-GLYCERIN EX PADS: 1.0000 "application " | MEDICATED_PAD | CUTANEOUS | Status: DC | PRN

## 2014-10-03 MED ORDER — MEPERIDINE HCL 25 MG/ML IJ SOLN: 6.2500 mg | INTRAMUSCULAR | Status: DC | PRN

## 2014-10-03 MED ORDER — LACTATED RINGERS IV SOLN
INTRAVENOUS | Status: DC
  Administered 2014-10-03 (×3): via INTRAVENOUS

## 2014-10-03 MED ORDER — OXYTOCIN 40 UNITS IN LACTATED RINGERS INFUSION - SIMPLE MED
62.5000 mL/h | INTRAVENOUS | Status: AC
Start: 2014-10-03 — End: 2014-10-04

## 2014-10-03 MED ORDER — FENTANYL CITRATE (PF) 100 MCG/2ML IJ SOLN
INTRAMUSCULAR | Status: DC | PRN
  Administered 2014-10-03: 10 ug via INTRATHECAL

## 2014-10-03 MED ORDER — MORPHINE SULFATE (PF) 0.5 MG/ML IJ SOLN
INTRAMUSCULAR | Status: DC | PRN
  Administered 2014-10-03: .2 mg via INTRATHECAL

## 2014-10-03 MED ORDER — NALBUPHINE HCL 10 MG/ML IJ SOLN
INTRAMUSCULAR | Status: AC
  Filled 2014-10-03: qty 1

## 2014-10-03 MED ORDER — MENTHOL 3 MG MT LOZG: 1.0000 | LOZENGE | OROMUCOSAL | Status: DC | PRN

## 2014-10-03 MED ORDER — DIBUCAINE 1 % RE OINT: 1.0000 "application " | TOPICAL_OINTMENT | RECTAL | Status: DC | PRN

## 2014-10-03 MED ORDER — ONDANSETRON HCL 4 MG/2ML IJ SOLN: 4.0000 mg | Freq: Once | INTRAMUSCULAR | Status: DC | PRN

## 2014-10-03 MED ORDER — PHENYLEPHRINE 8 MG IN D5W 100 ML (0.08MG/ML) PREMIX OPTIME
INJECTION | INTRAVENOUS | Status: DC | PRN
  Administered 2014-10-03: 60 ug/min via INTRAVENOUS

## 2014-10-03 MED ORDER — DIPHENHYDRAMINE HCL 25 MG PO CAPS
25.0000 mg | ORAL_CAPSULE | ORAL | Status: DC | PRN
  Administered 2014-10-03 – 2014-10-04 (×2): 25 mg via ORAL
  Filled 2014-10-03 (×2): qty 1

## 2014-10-03 MED ORDER — CEFAZOLIN SODIUM-DEXTROSE 2-3 GM-% IV SOLR
INTRAVENOUS | Status: AC
  Filled 2014-10-03: qty 50

## 2014-10-03 MED ORDER — LACTATED RINGERS IV SOLN
INTRAVENOUS | Status: DC | PRN
  Administered 2014-10-03: 08:00:00 via INTRAVENOUS

## 2014-10-03 MED ORDER — TRIAMCINOLONE ACETONIDE 40 MG/ML IJ SUSP
Freq: Once | INTRAMUSCULAR | Status: AC
  Administered 2014-10-03: 11 mL via INTRAMUSCULAR
  Filled 2014-10-03 (×2): qty 1

## 2014-10-03 MED ORDER — KETOROLAC TROMETHAMINE 30 MG/ML IJ SOLN
INTRAMUSCULAR | Status: AC
  Filled 2014-10-03: qty 1

## 2014-10-03 MED ORDER — OXYCODONE-ACETAMINOPHEN 5-325 MG PO TABS: 2.0000 | ORAL_TABLET | ORAL | Status: DC | PRN

## 2014-10-03 MED ORDER — ONDANSETRON HCL 4 MG/2ML IJ SOLN: 4.0000 mg | Freq: Three times a day (TID) | INTRAMUSCULAR | Status: DC | PRN

## 2014-10-03 MED ORDER — KETOROLAC TROMETHAMINE 30 MG/ML IJ SOLN
30.0000 mg | Freq: Four times a day (QID) | INTRAMUSCULAR | Status: AC | PRN
  Administered 2014-10-03: 30 mg via INTRAMUSCULAR

## 2014-10-03 MED ORDER — NALBUPHINE HCL 10 MG/ML IJ SOLN
5.0000 mg | Freq: Once | INTRAMUSCULAR | Status: DC | PRN
  Filled 2014-10-03: qty 0.5

## 2014-10-03 MED ORDER — ONDANSETRON HCL 4 MG/2ML IJ SOLN
INTRAMUSCULAR | Status: AC
  Filled 2014-10-03: qty 2

## 2014-10-03 MED ORDER — SCOPOLAMINE 1 MG/3DAYS TD PT72
MEDICATED_PATCH | TRANSDERMAL | Status: AC
  Administered 2014-10-03: 1.5 mg via TRANSDERMAL
  Filled 2014-10-03: qty 1

## 2014-10-03 MED ORDER — NALOXONE HCL 0.4 MG/ML IJ SOLN: 0.4000 mg | INTRAMUSCULAR | Status: DC | PRN

## 2014-10-03 MED ORDER — OXYTOCIN 10 UNIT/ML IJ SOLN
40.0000 [IU] | INTRAVENOUS | Status: DC | PRN
  Administered 2014-10-03: 40 [IU] via INTRAVENOUS

## 2014-10-03 MED ORDER — PRENATAL MULTIVITAMIN CH
1.0000 | ORAL_TABLET | Freq: Every day | ORAL | Status: DC
Start: 2014-10-03 — End: 2014-10-05
  Administered 2014-10-04 – 2014-10-05 (×2): 1 via ORAL
  Filled 2014-10-03 (×2): qty 1

## 2014-10-03 MED ORDER — IBUPROFEN 600 MG PO TABS
600.0000 mg | ORAL_TABLET | Freq: Four times a day (QID) | ORAL | Status: DC
  Administered 2014-10-03 – 2014-10-05 (×8): 600 mg via ORAL
  Filled 2014-10-03 (×8): qty 1

## 2014-10-03 SURGICAL SUPPLY — 44 items
BENZOIN TINCTURE PRP APPL 2/3 (GAUZE/BANDAGES/DRESSINGS) ×3 IMPLANT
BLADE SURG 10 STRL SS (BLADE) ×3 IMPLANT
CLAMP CORD UMBIL (MISCELLANEOUS) ×3 IMPLANT
CLOSURE WOUND 1/2 X4 (GAUZE/BANDAGES/DRESSINGS) ×1
CLOTH BEACON ORANGE TIMEOUT ST (SAFETY) ×3 IMPLANT
CONTAINER PREFILL 10% NBF 15ML (MISCELLANEOUS) IMPLANT
DECANTER SPIKE VIAL GLASS SM (MISCELLANEOUS) ×3 IMPLANT
DRAPE SHEET LG 3/4 BI-LAMINATE (DRAPES) ×3 IMPLANT
DRSG OPSITE POSTOP 4X10 (GAUZE/BANDAGES/DRESSINGS) ×3 IMPLANT
DURAPREP 26ML APPLICATOR (WOUND CARE) ×3 IMPLANT
ELECT REM PT RETURN 9FT ADLT (ELECTROSURGICAL) ×3
ELECTRODE REM PT RTRN 9FT ADLT (ELECTROSURGICAL) ×1 IMPLANT
EXTRACTOR VACUUM KIWI (MISCELLANEOUS) IMPLANT
EXTRACTOR VACUUM M CUP 4 TUBE (SUCTIONS) IMPLANT
EXTRACTOR VACUUM M CUP 4' TUBE (SUCTIONS)
GLOVE BIO SURGEON STRL SZ7 (GLOVE) ×3 IMPLANT
GLOVE BIOGEL PI IND STRL 7.0 (GLOVE) ×4 IMPLANT
GLOVE BIOGEL PI INDICATOR 7.0 (GLOVE) ×8
GOWN STRL REUS W/TWL LRG LVL3 (GOWN DISPOSABLE) ×6 IMPLANT
KIT ABG SYR 3ML LUER SLIP (SYRINGE) IMPLANT
NEEDLE HYPO 22GX1.5 SAFETY (NEEDLE) ×3 IMPLANT
NEEDLE HYPO 25X5/8 SAFETYGLIDE (NEEDLE) IMPLANT
NS IRRIG 1000ML POUR BTL (IV SOLUTION) ×3 IMPLANT
PACK C SECTION WH (CUSTOM PROCEDURE TRAY) ×3 IMPLANT
PAD ABD 7.5X8 STRL (GAUZE/BANDAGES/DRESSINGS) ×3 IMPLANT
PAD ABD 8X7 1/2 STERILE (GAUZE/BANDAGES/DRESSINGS) ×3 IMPLANT
PAD OB MATERNITY 4.3X12.25 (PERSONAL CARE ITEMS) ×3 IMPLANT
PENCIL SMOKE EVAC W/HOLSTER (ELECTROSURGICAL) ×3 IMPLANT
RTRCTR C-SECT PINK 25CM LRG (MISCELLANEOUS) ×3 IMPLANT
SPONGE GAUZE 4X4 12PLY STER LF (GAUZE/BANDAGES/DRESSINGS) ×3 IMPLANT
STRIP CLOSURE SKIN 1/2X4 (GAUZE/BANDAGES/DRESSINGS) ×2 IMPLANT
SUT MON AB-0 CT1 36 (SUTURE) ×9 IMPLANT
SUT PLAIN 0 NONE (SUTURE) IMPLANT
SUT PLAIN 2 0 (SUTURE) ×2
SUT PLAIN ABS 2-0 CT1 27XMFL (SUTURE) ×1 IMPLANT
SUT VIC AB 0 CT1 27 (SUTURE) ×4
SUT VIC AB 0 CT1 27XBRD ANBCTR (SUTURE) ×2 IMPLANT
SUT VIC AB 2-0 CT1 27 (SUTURE) ×6
SUT VIC AB 2-0 CT1 TAPERPNT 27 (SUTURE) ×3 IMPLANT
SUT VIC AB 4-0 KS 27 (SUTURE) ×3 IMPLANT
SUT VICRYL 0 TIES 12 18 (SUTURE) IMPLANT
SYR CONTROL 10ML LL (SYRINGE) ×3 IMPLANT
TOWEL OR 17X24 6PK STRL BLUE (TOWEL DISPOSABLE) ×3 IMPLANT
TRAY FOLEY CATH SILVER 14FR (SET/KITS/TRAYS/PACK) ×3 IMPLANT

## 2014-10-03 NOTE — Anesthesia Postprocedure Evaluation (Signed)
Anesthesia Post Note  Patient: Marissa Acevedo  Procedure(s) Performed: Procedure(s) (LRB): Repeat CESAREAN SECTION (N/A)  Anesthesia type: Spinal  Patient location: Mother/Baby  Post pain: Pain level controlled  Post assessment: Post-op Vital signs reviewed  Last Vitals:  Filed Vitals:   10/03/14 1200  BP: 117/64  Pulse: 65  Temp: 36.8 C  Resp: 18    Post vital signs: Reviewed  Level of consciousness: awake  Complications: No apparent anesthesia complications

## 2014-10-03 NOTE — Lactation Note (Signed)
This note was copied from the chart of Marissa Marissa Acevedo. Lactation Consultation Note  Patient Name: Marissa Acevedo [id number] Date: 10/03/2014 Reason for consult: Initial assessment;Difficult latch Mom is reporting frequent feedings and nipple pain. Baby has a tight labial frenulum and noticeable lingual frenulum. Baby can extend tongue +1cm past gum ridge, above midline, has good cupping, but seems to tire easily while eating. Mom reports good intitial latch then a bite followed by clicking. She has a compression stripe on R along with bilateral bruising of the nipple tips. She was only able to pump 6 month for her 28 yr old because of poor latch, nipple pain, mastitis, and slow wt gain. Talked about using a gloved finger or very clean hands to try stretching the frenulum when baby is not eating. She might need to pump if baby is not moving milk well and she can ask for comfort gels as needed. Give bf resources, aware of support group, and O/P lactation services.     Maternal Data Has patient been taught Hand Expression?: Yes Does the patient have breastfeeding experience prior to this delivery?: Yes  Feeding Feeding Type: Breast Fed Length of feed: 15 min  LATCH Score/Interventions Latch: Repeated attempts needed to sustain latch, nipple held in mouth throughout feeding, stimulation needed to elicit sucking reflex. Intervention(s): Adjust position;Assist with latch  Audible Swallowing: A few with stimulation Intervention(s): Skin to skin;Hand expression  Type of Nipple: Everted at rest and after stimulation  Comfort (Breast/Nipple): Filling, red/small blisters or bruises, mild/mod discomfort  Problem noted: Mild/Moderate discomfort;Cracked, bleeding, blisters, bruises Interventions  (Cracked/bleeding/bruising/blister): Expressed breast milk to nipple Interventions (Mild/moderate discomfort): Hand expression  Hold (Positioning): Assistance needed to correctly position infant at  breast and maintain latch. Intervention(s): Support Pillows;Position options  LATCH Score: 6  Lactation Tools Discussed/Used WIC Program: Yes   Consult Status Consult Status: Follow-up Date: 10/04/14 Follow-up type: In-patient    Rulon Eisenmenger 10/03/2014, 10:31 PM

## 2014-10-03 NOTE — Op Note (Signed)
Cesarean Section Procedure Note 10/03/2014 Donald Siva   Procedure: Repeat Low Transverse Cesarean section  Indications: Scheduled Proceedure/Maternal Request   Pre-operative Diagnosis: Previous Cesarean Section. 39 wks   Post-operative Diagnosis: Same   Surgeon:Vaishali Juliene Pina, MD  Assistants: Donette Larry, CNM  Anesthesia: spinal   Procedure Details:  The patient was seen in the Holding Room. The risks, benefits, complications, treatment options, and expected outcomes were discussed with the patient. The patient concurred with the proposed plan, giving informed consent. identified as Nikayla Madaris and the procedure verified as Repeat C-Section Delivery. She was brought to the Operating room, 2 gm Ancef started. A Time Out was held and the above information confirmed.  After induction of Spinal anesthesia, the patient was draped and prepped in the usual sterile manner. A Pfannenstiel Incision was made and carried down through the subcutaneous tissue to the fascia. Fascial incision was made and extended transversely. The fascia was separated from the underlying rectus tissue superiorly and inferiorly. The peritoneum was identified and entered. Peritoneal incision was extended longitudinally. Alexis retractor placed. A low transverse uterine incision was made. Clear copious amniotic fluid drained.  Delivered from cephalic presentation was vigorous FEMALE infant at 7.50 am on 10/03/14, with Apgar scores of 8 at one minute and 9 at five minutes. Loose nuchal cord was released over the head and then delivery was completed. Cord clamped and cut and baby handed to NICU team in attendance. Placenta removed complete after uterus contracted well, and was given off for cord blood banking and cord blood was sent to pathology. Placenta appeared normal.  The uterine outline, tubes and ovaries appeared normal, no extensions noted. The uterine incision was closed in Single layer due to lower segment vascularity  with running locked sutures of . .   Hemostasis was observed. Retractor removed. Peritoneal closure done with 2-0 Vicryl. Muscles approximated in midline in lower half. The fascia was then reapproximated with running sutures of 0Vicryl. The subcuticular closure was performed using 2-0plain gut. Kenalog injection in 10cc of 0.25% Marcaine injected under the skin and the skin was closed with 4-0Vicryl. Steristrips and honeycomb  dressing and pressure dressing placed.   Instrument, sponge, and needle counts were correct prior the abdominal closure and were correct at the conclusion of the case.    Findings: Female infant, delivered cephalic from [facility] Hysterotomy (low transverse), Apgars 8 and 9. Incision closed in 1 layer due to lower segment varicosities. Normal tubes and ovaries. Normal placenta, cord.    Estimated Blood Loss: 650 cc   Total IV Fluids: 3400 ml   Urine Output: 150CC OF clear urine  Specimens: cord blood sample to path and rest for banking  Complications: no complications  Disposition: PACU - hemodynamically stable.   Maternal Condition: stable   Baby condition / location:  Couplet care / Skin to Skin  Attending Attestation: I performed the procedure.   Signed: Surgeon(s): Shea Evans, MD

## 2014-10-03 NOTE — H&P (Addendum)
Shaeley Segall is a 29 y.o. female presenting for repeat C/section at 39 wks. Prior C/s x1, elective RC/s desired. Uncomplicated pregnancy.   History OB History    Gravida Para Term Preterm AB TAB SAB Ectopic Multiple Living   Past Medical History  Diagnosis Date  . PCOS (polycystic ovarian syndrome)   . Polycystic disease, ovaries   . Medical history non-contributory    Past Surgical History  Procedure Laterality Date  . Breast enhancement surgery    . Cesarean section N/A 10/04/2012    Procedure: Primary CESAREAN SECTION;x1  Surgeon: Robley Fries, MD;  Location: [facility];  Service: Obstetrics;  Laterality: N/A;  EDD: 10/09/12   Family History: family history is not on file. Social History:  reports that she has never smoked. She has never used smokeless tobacco. She reports that she does not drink alcohol. Her drug history is not on file.   Prenatal Transfer Tool  Maternal Diabetes: No Genetic Screening: Declined Maternal Ultrasounds/Referrals: Normal Fetal Ultrasounds or other Referrals:  None Maternal Substance Abuse:  No Significant Maternal Medications:  None Significant Maternal Lab Results:  GBS(+)  Other Comments:  None  ROS neg     Blood pressure 116/93, pulse 91, temperature 98.2 F (36.8 C), temperature source Oral, resp. rate 18, last menstrual period 01/03/2014, SpO2 100 %, unknown if currently breastfeeding. Exam Physical Exam  Physical exam:  A&O x 3, no acute distress. Pleasant HEENT neg, no thyromegaly Lungs CTA bilat CV RRR, S1S2 normal Abdo soft, non tender, non acute Extr no edema/ tenderness Pelvic def FHT  130s Toco none  Prenatal labs: ABO, Rh: --/--/O POS (09/26 0910) Antibody: NEG (09/26 0910) Rubella: Immune (03/09 0000) RPR: Non Reactive (09/26 0910)  HBsAg: Negative (03/09 0000)  HIV: Non-reactive (03/09 0000)  GBS:   Positive  Assessment/Plan: 29 yo, 39 wks, here for RC/s.  Risks/complications of surgery  reviewed incl infection, bleeding, damage to internal organs including bladder, bowels, ureters, blood vessels, other risks from anesthesia, VTE and delayed complications of any surgery, complications in future surgery reviewed. Also discussed neonatal complications incl difficult delivery, laceration, vacuum assistance, TTN etc. Pt understands and agrees, all concerns addressed.     MODY,VAISHALI R 10/03/2014, 7:19 AM

## 2014-10-03 NOTE — Transfer of Care (Signed)
Immediate Anesthesia Transfer of Care Note  Patient: Marissa Acevedo  Procedure(s) Performed: Procedure(s) with comments: Repeat CESAREAN SECTION (N/A) - EDD: 10/10/14  Patient Location: PACU  Anesthesia Type:Spinal  Level of Consciousness: awake, alert  and oriented  Airway & Oxygen Therapy: Patient Spontanous Breathing  Post-op Assessment: Report given to RN and Post -op Vital signs reviewed and stable  Post vital signs: Reviewed and stable  Last Vitals:  Filed Vitals:   10/03/14 0604  BP: 116/93  Pulse: 91  Temp: 36.8 C  Resp: 18    Complications: No apparent anesthesia complications

## 2014-10-03 NOTE — Anesthesia Procedure Notes (Signed)
Spinal Patient location during procedure: OR Staffing Anesthesiologist: JUDD, MARY Performed by: anesthesiologist  Preanesthetic Checklist Completed: patient identified, site marked, surgical consent, pre-op evaluation, timeout performed, IV checked, risks and benefits discussed and monitors and equipment checked Spinal Block Patient position: sitting Prep: ChloraPrep Patient monitoring: continuous pulse ox, blood pressure and heart rate Approach: midline Location: L3-4 Injection technique: single-shot Needle Needle type: Sprotte  Needle gauge: 24 G Needle length: 9 cm Additional Notes Functioning IV was confirmed and monitors were applied. Sterile prep and drape, including hand hygiene, mask and sterile gloves were used. The patient was positioned and the spine was prepped. The skin was anesthetized with lidocaine.  Free flow of clear CSF was obtained prior to injecting local anesthetic into the CSF.  The spinal needle aspirated freely following injection.  The needle was carefully withdrawn.  The patient tolerated the procedure well. Consent was obtained prior to procedure with all questions answered and concerns addressed. Risks including but not limited to bleeding, infection, nerve damage, paralysis, failed block, inadequate analgesia, allergic reaction, high spinal, itching and headache were discussed and the patient wished to proceed.   Mary Judd, MD     

## 2014-10-04 ENCOUNTER — Encounter (HOSPITAL_COMMUNITY): Payer: Self-pay | Admitting: Obstetrics & Gynecology

## 2014-10-04 LAB — CBC
HCT: 35.7 % — ABNORMAL LOW (ref 36.0–46.0)
Hemoglobin: 11.9 g/dL — ABNORMAL LOW (ref 12.0–15.0)
MCH: 31.3 pg (ref 26.0–34.0)
MCHC: 33.3 g/dL (ref 30.0–36.0)
MCV: 93.9 fL (ref 78.0–100.0)
PLATELETS: 209 10*3/uL (ref 150–400)
RBC: 3.8 MIL/uL — AB (ref 3.87–5.11)
RDW: 13.9 % (ref 11.5–15.5)
WBC: 11.2 10*3/uL — AB (ref 4.0–10.5)

## 2014-10-04 LAB — CCBB MATERNAL DONOR DRAW

## 2014-10-04 NOTE — Lactation Note (Signed)
This note was copied from the chart of Marissa Kirstein Baxley. Lactation Consultation Note  Patient Name: Marissa Acevedo [id number] Date: 10/04/2014 Reason for consult: Follow-up assessment;Difficult latch;Breast surgery   Follow-up at 31 hours old; GA 39.0; 4% weight loss at last midnight's weight check at 16 hours old.     Mom is a P2 with 1 month breastfeeding experience with first child (d/t pain with feedings) and then pumped for remaining 6 months.  Mom Hx PCOS & Breast Implants.  Mom stated she pumped 5-10 ounces with first child at each pumping.   Mom c/o sore nipples with feedings.  Mom has comfort gels given by RN.  LC explained how to use and purpose. Infant has breastfed x8 (10-40 min) + attempt x2 (0 min); voids-4 in past 24 hours; stools-3 in past 24 hours.  LS-6 by RN [facility] reviewed hand expression with mom; did not observe any colostrum, but mom stated she was able to pump 5-10 ounces with first child at each pumping.   LC performed oral assessment and I agree with previous LC's charted assessment.  Upper lip frenulum is thick and extends to gum line creating a wide spaced division between right and left upper gum ridge.  Lip blisters noted beginning to form.  With gloved fingers, lifted tongue and noted short thick posterior frenulum; could not finger sweep straight across under tongue d/t thick posterior frenulum.  Abnormal infant sucking on gloved finger; infant could not maintain consistent contact with finger and had a slapping motion when sucking on gloved finger, also noted lack of rhythmic wave like motion d/t tongue restriction.  Mom reports difficulty feeding first child (pain, colicky, would not stay on breast, etc...) that led to early cessation of breastfeeding with first child.   LC reviewed with mom assessment findings related to breastfeeding; tips and suggestions given.  Encouraged mom to seek further professional assessment from trained expert and who treats tongue  restrictions.   LC observed mom latch infant with wide mouth and flanged lips; mom c/o pain with feeding, but mom wanted to continue to feed.  Several swallows heard by LC at beginning of feeding.  LS-8.  Baby had just fed 1 hour prior and only fed for 5 minutes at this feeding. LC discussed the need to pump and given any EBM back to infant to help stimulate and maintain milk supply.  LC set up DEBP; return demonstration of using preemie setting with 3-4 teardrops; taught how to pump with hands-on pumping and hand expression at end of pumping session.   Curved-tip syringe, colostrum collection containers, spoons, and foley cup given for EBM feedings that encourage sticking tongue out with feedings.  Demonstrated how to use spoon and cup for cup feeding.   Mom inquired of using SNS after her milk supply increases so the baby will feed from breast.  LC gave SNS and demonstrated how to use (using water) for larger EBM feeding supplementations at breast.  Mom verbalized comfort with SNS; LC encouraged making Outpatient appointment if needed after discharge.   Mom pumped with #24 flanges and pumped 2 ml colostrum.  Mom re-latched infant and LC taught how to use curved-tip syringe at breast for feeding EBM for smaller amounts.   Discussed Plan of Care with mom and MGM (support person in room).  Mom verbalized understanding and her desire to follow-through with Plan of Care.   Discussed potential need to supplement with alimentum formula if infant does not seem content after breastfeeding  and EBM supplementation and considering tonight's weight loss.  Day of Life supplementation guideline given to mom and discussed.  Encouraged mom to continue pumping and hand expressing for EBM supplementation.   Discuss with RN plan of care.     Plan of Care: 1.  Continue to feed with feeding cues. 2.  Latch infant on first side for as long as she will feed, then offer second side. 3.  At end of feeding, give infant to  support person in room for spoon or cup feeding EBM from previous post-pumping session. 4.  While infant is receiving EBM from support person, mom is to pump with DEBP on preemie setting.       Maternal Data Has patient been taught Hand Expression?: Yes Does the patient have breastfeeding experience prior to this delivery?: Yes  Feeding Feeding Type: Breast Fed Length of feed: 5 min  LATCH Score/Interventions Latch: Grasps breast easily, tongue down, lips flanged, rhythmical sucking.  Audible Swallowing: A few with stimulation  Type of Nipple: Everted at rest and after stimulation  Comfort (Breast/Nipple): Filling, red/small blisters or bruises, mild/mod discomfort  Problem noted: Mild/Moderate discomfort Interventions (Mild/moderate discomfort): Comfort gels;Post-pump;Hand expression  Hold (Positioning): No assistance needed to correctly position infant at breast. Intervention(s): Breastfeeding basics reviewed;Support Pillows;Position options;Skin to skin  LATCH Score: 8  Lactation Tools Discussed/Used Pump Review: Setup, frequency, and cleaning;Milk Storage Initiated by:: Burna Sis, RN, MSN, IBCLC Date initiated:: 10/04/14   Consult Status Consult Status: Follow-up Date: 10/05/14 Follow-up type: In-patient    Lendon Ka 10/04/2014, 5:16 PM

## 2014-10-04 NOTE — Progress Notes (Signed)
Patient ID: Marissa Acevedo, female   DOB: May 11, 1985, 29 y.o.   MRN: [id number] Subjective: Repeat Cesarean Delivery  POD# 1 Information for the patient's newborn:  [place], [place] Girl Arnie [[id number]]  female   Reports feeling well Feeding: breast Patient reports tolerating PO.  Breast symptoms: none Pain controlled with ibuprofen (OTC) and narcotic analgesics including Percocet Denies HA/SOB/C/P/N/V/dizziness. Flatus absent. No BM. She reports vaginal bleeding as normal, without clots.  She is ambulating, urinating without difficult.     Objective:   VS:  Filed Vitals:   10/03/14 1632 10/03/14 2115 10/04/14 0150 10/04/14 0600  BP:  111/61 102/58 94/49  Pulse:  73 57 56  Temp: 98.7 F (37.1 C) 98.6 F (37 C) 98.8 F (37.1 C) 97.9 F (36.6 C)  TempSrc: Oral Oral Oral Oral  Resp:  SpO2:  97% 100% 98%     Intake/Output Summary (Last 24 hours) at 10/04/14 1714 Last data filed at 10/04/14 0600  Gross per 24 hour  Intake   2297 ml  Output   2050 ml  Net    247 ml        Recent Labs  10/02/14 0910 10/04/14 0630  WBC 9.2 11.2*  HGB 12.9 11.9*  HCT 38.7 35.7*  PLT 225 209     Blood type: --/--/O POS (09/26 0910)  Rubella: Immune (03/09 0000)     Physical Exam:  General: alert, cooperative and no distress CV: Regular rate and rhythm, S1S2 present or without murmur or extra heart sounds Resp: clear Abdomen: soft, nontender, normal bowel sounds Incision: C/D/I - pressure dressing removed / skin well-approximated with sutures Uterine Fundus: firm, 2 FB below umbilicus, nontender Lochia: minimal Ext: extremities normal, atraumatic, no cyanosis or edema and Homans sign is negative, no sign of DVT   Assessment/Plan: 29 y.o.   POD# 1.  s/p Cesarean Delivery.  Indications: repeat                Principal Problem:   Postpartum care following cesarean delivery (9/27) Active Problems:   Status post repeat low transverse cesarean section  Doing well, stable.              Regular diet as tolerated Ambulate Routine post-op care   Kenard Gower, MSN, CNM 10/04/2014, 5:14 PM

## 2014-10-05 MED ORDER — OXYCODONE-ACETAMINOPHEN 5-325 MG PO TABS: 1.0000 | ORAL_TABLET | ORAL | Status: AC | PRN

## 2014-10-05 MED ORDER — IBUPROFEN 600 MG PO TABS: 600.0000 mg | ORAL_TABLET | Freq: Four times a day (QID) | ORAL | Status: AC

## 2014-10-05 NOTE — Progress Notes (Signed)
POSTOPERATIVE DAY # 2 S/P repeat CS  S:         Reports feeling well - desire DC today             Tolerating po intake / no nausea / no vomiting / + flatus / no BM             Bleeding is light             Pain controlled with tylenol and Motrin with occasional 1/2-1 percocet             Up ad lib / ambulatory/ voiding QS  Newborn breast feeding  / female  O:  VS: BP 111/64 mmHg  Pulse 67  Temp(Src) 98.4 F (36.9 C) (Oral)  Resp 17  SpO2 98%  LMP 01/03/2014  Breastfeeding? Unknown   LABS:               Recent Labs  10/02/14 0910 10/04/14 0630  WBC 9.2 11.2*  HGB 12.9 11.9*  PLT 225 209               Bloodtype: --/--/O POS (09/26 0910)  Rubella: Immune (03/09 0000)                                Physical Exam:             Alert and Oriented X3  Lungs: Clear and unlabored  Heart: regular rate and rhythm / no mumurs  Abdomen: soft, non-tender, non-distended              Fundus: firm, non-tender, Ueven             Dressing intact honeycomb              Incision:  approximated with suture / no erythema / no ecchymosis / no drainage  Extremities: 1+ pedal edema, no calf pain or tenderness, neg Homans  A:        POD # 2 S/P CS             P:         routine postoperative care              stable status             DC home - [facility] booklet - instructions reviewed    Marlinda Mike CNM, MSN, FACNM 10/05/2014, 8:18 AM

## 2014-10-05 NOTE — Lactation Note (Signed)
This note was copied from the chart of Marissa Acevedo. Lactation Consultation Note: Mother has been feeding infant with a finger and a curved tip syringe. Discussed use of a nipple shield to bridge infant back to the breast. Mother advised in proper application of the shield. #20 mm too tight and #24 fit better. Mother advised in adjusting infants lower jaw for wider gape and rolling infants top lip upward. MGM at bedside for assistance. Infant sustained latch for 25 mins. Infant was given 15 ml of ebm through the nipple shield. Observed milk in the shield and round nipple when infant released the breast. Mother was given comfort gels and sore nipple shells. Mother was given a plan to post pump for 15-20 ml after each feeding until milk comes to volume. Mother has supplemental guidelines. She was advised to offer infant additional ebm after each feeding. Mother has her own pump at home. She has an appt with DR Orland Mustard on October 13 . Advised mother to follow up with LC in one week to observed infants transfer using a nipple shield. Mother was advised to phone Dr Juliene Pina to get an Rx for [facility] for nipples. Mother has pink nipples with a small crack on the Rt nipple. With mothers history of Mastitis 2 times with last child, I advised to use of APNO . Mother advised to nap frequently and take in lots of flds. Reviewed S/S of Mastitis. Mother also advised treatment plant to prevent engorgement. Advised to cue feed infant and do frequent STS.   Patient Name: Marissa Bali Lyn [id number] Date: 10/05/2014 Reason for consult: Follow-up assessment   Maternal Data    Feeding Feeding Type: Breast Milk Length of feed: 25 min  LATCH Score/Interventions Latch: Grasps breast easily, tongue down, lips flanged, rhythmical sucking. Intervention(s): Adjust position;Assist with latch;Breast compression  Audible Swallowing: Spontaneous and intermittent Intervention(s): Skin to skin;Hand expression  Type of Nipple:  Everted at rest and after stimulation  Comfort (Breast/Nipple): Filling, red/small blisters or bruises, mild/mod discomfort  Problem noted: Filling;Mild/Moderate discomfort Interventions  (Cracked/bleeding/bruising/blister): Expressed breast milk to nipple;Double electric pump Interventions (Mild/moderate discomfort): Hand expression;Comfort gels;Breast shields  Hold (Positioning): Assistance needed to correctly position infant at breast and maintain latch. Intervention(s): Support Pillows;Position options  LATCH Score: 8  Lactation Tools Discussed/Used Tools: Nipple Shields Nipple shield size: 20;24   Consult Status Consult Status: Complete    Michel Bickers 10/05/2014, 11:55 AM

## 2014-10-05 NOTE — Lactation Note (Signed)
This note was copied from the chart of Marissa Acevedo. Lactation Consultation Note  Patient Name: Marissa Acevedo [id number] Date: 10/05/2014 Reason for consult: Follow-up assessment  F/U apt made for Friday Oct. 7 th at 1030 am , apt reminder  Given to mom. Extra #24 NS provided and moms request.   Maternal Data    Feeding Feeding Type: Breast Milk Length of feed: 25 min  LATCH Score/Interventions Latch: Grasps breast easily, tongue down, lips flanged, rhythmical sucking. Intervention(s): Adjust position;Assist with latch;Breast compression  Audible Swallowing: Spontaneous and intermittent Intervention(s): Skin to skin;Hand expression  Type of Nipple: Everted at rest and after stimulation  Comfort (Breast/Nipple): Filling, red/small blisters or bruises, mild/mod discomfort  Problem noted: Filling;Mild/Moderate discomfort Interventions  (Cracked/bleeding/bruising/blister): Expressed breast milk to nipple;Double electric pump Interventions (Mild/moderate discomfort): Hand expression;Comfort gels;Breast shields  Hold (Positioning): Assistance needed to correctly position infant at breast and maintain latch. Intervention(s): Support Pillows;Position options  LATCH Score: 8  Lactation Tools Discussed/Used Tools: Nipple Shields Nipple shield size: 20;24   Consult Status Consult Status: Complete    Kathrin Greathouse 10/05/2014, 10:56 AM

## 2014-10-05 NOTE — Discharge Summary (Signed)
  POSTOPERATIVE DISCHARGE SUMMARY:  Patient ID: Marissa Acevedo MRN: [id number] DOB/AGE: Jun 06, 1985 29 y.o.  Admit date: 10/03/2014 Admission Diagnoses: 39.0 weeks / previous cesarean delivery - elects to repeat  Discharge date:  10/05/2014 Discharge Diagnoses: POD 2 s/p repeat cesarean section  Prenatal history: G2P2002   EDC : 10/10/2014, by Last Menstrual Period  Prenatal care at [facility]  Primary provider: Mody Prenatal course complicated by previous CS x 1  Prenatal Labs: ABO, Rh: --/--/O POS (09/26 7829)  Antibody: NEG (09/26 0910) Rubella: Immune (03/09 0000) RPR: Non Reactive (09/26 0910)  HBsAg: Negative (03/09 0000)  HIV: Non-reactive (03/09 0000)  GTT : NL  Medical / Surgical History :  Past medical history:  Past Medical History  Diagnosis Date  . PCOS (polycystic ovarian syndrome)   . Polycystic disease, ovaries   . Medical history non-contributory     Past surgical history:  Past Surgical History  Procedure Laterality Date  . Breast enhancement surgery    . Cesarean section N/A 10/04/2012    Procedure: Primary CESAREAN SECTION;x1  Surgeon: Robley Fries, MD;  Location: [facility];  Service: Obstetrics;  Laterality: N/A;  EDD: 10/09/12  . Cesarean section N/A 10/03/2014    Procedure: Repeat CESAREAN SECTION;  Surgeon: Shea Evans, MD;  Location: [facility];  Service: Obstetrics;  Laterality: N/A;  EDD: 10/10/14    Family History: History reviewed. No pertinent family history.  Social History:  reports that she has never smoked. She has never used smokeless tobacco. She reports that she does not drink alcohol. Her drug history is not on file.  Allergies: Review of patient's allergies indicates no known allergies.   Current Medications at time of admission:  Prior to Admission medications   Medication Sig Start Date End Date Taking? Authorizing Provider  Prenatal Vit w/Fe-Methylfol-FA (PNV PO) Take 1 tablet by mouth daily.    Yes Historical  Provider, MD   Procedures: Cesarean section delivery on 10/03/2014 with delivery of viable female newborn by Dr Juliene Pina   See operative report for further details APGAR (1 MIN): 8   APGAR (5 MINS): 9    Postoperative / postpartum course:  Uncomplicated with discharge on POD 2   Discharge Instructions:  Discharged Condition: stable  Activity: pelvic rest and postoperative restrictions x 2   Diet: routine  Medications:    Medication List    TAKE these medications        ibuprofen 600 MG tablet  Commonly known as:  ADVIL,MOTRIN  Take 1 tablet (600 mg total) by mouth every 6 (six) hours.     oxyCODONE-acetaminophen 5-325 MG tablet  Commonly known as:  PERCOCET/ROXICET  Take 1 tablet by mouth every 4 (four) hours as needed (for pain scale 4-7).     PNV PO  Take 1 tablet by mouth daily.     senna-docusate 8.6-50 MG tablet  Commonly known as:  Senokot-S  Take 2 tablets by mouth daily as needed for constipation.        Wound Care: keep clean and dry / remove honeycomb POD 4 Postpartum Instructions: [facility] discharge booklet - instructions reviewed  Discharge to: Home  Follow up :   [facility] in 6 weeks for routine postpartum visit with Dr Juliene Pina                Signed: Marlinda Mike CNM, MSN, [facility] 10/05/2014, 8:26 AM

## 2014-10-12 ENCOUNTER — Ambulatory Visit (HOSPITAL_COMMUNITY): Payer: BLUE CROSS/BLUE SHIELD

## 2014-10-13 ENCOUNTER — Ambulatory Visit (HOSPITAL_COMMUNITY): Payer: BLUE CROSS/BLUE SHIELD

## 2014-10-25 ENCOUNTER — Ambulatory Visit (HOSPITAL_COMMUNITY)
Admission: RE | Admit: 2014-10-25 | Discharge: 2014-10-25 | Disposition: A | Discharge status: Home / Self Care | Payer: BLUE CROSS/BLUE SHIELD | Source: Ambulatory Visit | Attending: Obstetrics & Gynecology | Admitting: Obstetrics & Gynecology

## 2014-10-25 NOTE — Lactation Note (Signed)
Lactation Consult  Morrie Sheldonshley is here today with Emmy following a frenectomy. Upon evaluation a thick coating of milk was noted on most of her tongue.  Wounds are WNL, Tongue exercises were performed and her suction was somewhat weak.  She attached to the breast and handled MER well where as previously she had not.  She transferred 40 ml and was positioned on the opposite breast where an additional 30 ml was transferred. She did have some difficulty with handling the MER on the left side.  This may be related to muscle fatigue at this point.  Mom was encouraged not to press on her breast during feeding because it could be increasing the flow of milk  going into the baby's mouth thus making it difficult to handle.  After feeding milk was noted on her tongue but it was not as prominent.  Suspect that exercises and latching helped with elevation thus cleaning the tongue.  Tummy time was performed though it was difficult to get her to relax and bring her head and shoulders onto the mat. Plan is to continue sweeps and stretches, tummy time, tongue exercises, off-center latch,body work and pumping 2x a day.    Follow-up with lactation in 1 week.  Lactation Consultant:  Soyla DryerJoseph, Kemon Devincenzi  ________________________________________________________________________  8#11.5 oz  ________________________________________________________________________  Mother's Name: Donald SivaAshley Wiesen Breastfeeding Experience:  Pumped for 6 months with older child Maternal Medical Conditions:  Polycystic ovarian syndrome Breast augmentation  ________________________________________________________________________  Breastfeeding History (Post Discharge)  Frequency of breastfeeding:  Every 2-3 hours Duration of feeding:  20 min  Infant Intake and Output Assessment  Voids:  8-10 in 24 hrs.  Color:  Clear yellow Stools:  5-8 in 24 hrs.  Color:   Yellow  ________________________________________________________________________  Maternal Breast Assessment  Breast:  Soft Nipple:  bruised but denies any pain Pain level:  0 Pain interventions:  no pain, worked on deeper latch  _______________________________________________________________________ Feeding Assessment/Evaluation  Mom has not supplemented or bottle fed in the past 2 weeks.  Initial feeding assessment:  Pre-feed weight:  3954 g  (8 lb. 11.5 oz.) Post-feed weight:  4024 g  Amount transferred:  70 ml

## 2014-11-01 ENCOUNTER — Ambulatory Visit: Payer: Self-pay

## 2014-11-01 ENCOUNTER — Ambulatory Visit (HOSPITAL_COMMUNITY)
Admission: RE | Admit: 2014-11-01 | Discharge: 2014-11-01 | Disposition: A | Discharge status: Home / Self Care | Payer: BLUE CROSS/BLUE SHIELD | Source: Ambulatory Visit | Attending: Obstetrics & Gynecology | Admitting: Obstetrics & Gynecology

## 2014-11-01 NOTE — Lactation Note (Signed)
This note was copied from the chart of Marissa Acevedo. Lactation Consultation Note   Emmy is here today follow-up post frenectomy.  Mom's nipples continue to be bruised though she reports little pain.  Emmy' s tongue is still coated with milk suggesting her tongue is not making contact with the palate.  She lateralizes well but has difficulty pulling a gloved finger back to the hard and soft palate juncture. Gag reflex is sensitive and tongue humping and thrusting are noted.  She also does not manage MER well on the left breast. Several positions were tried without success.  She is gaining well and transferred 68 ml at this feeding.  Body work encouraged as were tongue exercises to help with mobility.  Plan is for mom to continue working with Emmy and to follow-up at her convenience. Mom was asked to e-mail Lactation Consultant with updates.  Baby's Name: Marissa Acevedo Date of Birth: 10/03/2013 Pediatrician: [place] Gender: female Gestational Age: [redacted]w[redacted]d (At Birth) Birth Weight: 7 lb 11.8 oz (3510 g) Weight at Discharge: Weight: 6 lb 15 oz (3147 g)Date of Discharge: 10/05/2014 Filed Weights   10/03/14 2317 10/05/14 0111 10/05/14 1154  Weight: 7 lb 6.9 oz (3370 g) 7 lb 0.4 oz (3185 g) 6 lb 15 oz (3147 g)   Weight today: 9# 6.2 oz ( increase of 11 oz on one week)    Patient Name: Marissa Grace Dolson Today's Date: 11/01/2014    Breccan Galant 11/01/2014, 2:34 PM    

## 2014-11-01 NOTE — Lactation Note (Signed)
This note was copied from the chart of Marissa Capermersyn Grace Mccarey. Lactation Consultation Note   Marissa Acevedo is here today follow-up post frenectomy.  Mom's nipples continue to be bruised though she reports little pain.  Marissa Acevedo' s tongue is still coated with milk suggesting her tongue is not making contact with the palate.  She lateralizes well but has difficulty pulling a gloved finger back to the hard and soft palate juncture. Gag reflex is sensitive and tongue humping and thrusting are noted.  She also does not manage MER well on the left breast. Several positions were tried without success.  She is gaining well and transferred 68 ml at this feeding.  Body work encouraged as were tongue exercises to help with mobility.  Plan is for mom to continue working with Marissa Acevedo and to follow-up at her convenience. Mom was asked to e-mail Lactation Consultant with updates.  Baby's Name: Marissa Acevedo Date of Birth: 10/03/2013 Pediatrician: Dierdre [facility] Gender: female Gestational Age: 1956w0d (At Birth) Birth Weight: 7 lb 11.8 oz (3510 g) Weight at Discharge: Weight: 6 lb 15 oz (3147 g)[facility]   [facility] 2317 10/05/14 0111 10/05/14 1154  Weight: 7 lb 6.9 oz (3370 g) 7 lb 0.4 oz (3185 g) 6 lb 15 oz (3147 g)   Weight today: 9# 6.2 oz ( increase of 11 oz on one week)    Patient Name: Marissa Acevedo [id number]'s Date: 11/01/2014    Soyla DryerJoseph, Maryann 11/01/2014, 2:34 PM
